# Patient Record
Sex: Male | Born: 1995
Health system: Southern US, Community
[De-identification: ages and names within clinical notes are randomized; demographics above are authoritative.]

## PROBLEM LIST (undated history)

## (undated) ENCOUNTER — Ambulatory Visit (HOSPITAL_COMMUNITY): Disposition: A | Discharge status: Other Acute Inpatient Hospital | Payer: 59

## (undated) ENCOUNTER — Ambulatory Visit (HOSPITAL_COMMUNITY): Discharge status: Absent without leave | Payer: 59

## (undated) ENCOUNTER — Ambulatory Visit (HOSPITAL_COMMUNITY): Admission: EM | Payer: Self-pay | Source: Home / Self Care

## (undated) DIAGNOSIS — F319 Bipolar disorder, unspecified: Secondary | ICD-10-CM

## (undated) DIAGNOSIS — W3400XA Accidental discharge from unspecified firearms or gun, initial encounter: Secondary | ICD-10-CM

## (undated) HISTORY — PX: CYST EXCISION: SHX5701

---
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
## 2015-02-02 ENCOUNTER — Encounter (HOSPITAL_COMMUNITY): Payer: Self-pay | Admitting: *Deleted

## 2015-02-02 ENCOUNTER — Emergency Department (HOSPITAL_COMMUNITY)
Admission: EM | Admit: 2015-02-02 | Discharge: 2015-02-02 | Disposition: A | Discharge status: Home / Self Care | Payer: Medicaid Other | Attending: Emergency Medicine | Admitting: Emergency Medicine

## 2015-02-02 DIAGNOSIS — F10929 Alcohol use, unspecified with intoxication, unspecified: Secondary | ICD-10-CM

## 2015-02-02 DIAGNOSIS — Z720 Tobacco use: Secondary | ICD-10-CM | POA: Insufficient documentation

## 2015-02-02 DIAGNOSIS — F10129 Alcohol abuse with intoxication, unspecified: Secondary | ICD-10-CM | POA: Insufficient documentation
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
## 2015-02-02 NOTE — ED Notes (Signed)
Pt awaken, given breakfast tray and eaten 100% of meal. Pt encourage to call family/friend for transport to home. Pt verbalized understanding.

## 2015-02-02 NOTE — ED Notes (Signed)
Resting quietly with eye closed. Easily arousable. Verbally responsive. Resp even and unlabored. No audible adventitious breath sounds noted. ABC's intact. NAD noted.
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
## 2015-02-02 NOTE — ED Notes (Signed)
Resting quietly with eye closed. Easily arousable. Verbally responsive. Resp even and unlabored. ABC's intact. NAD noted.  
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
## 2015-02-02 NOTE — Discharge Instructions (Signed)
Alcohol Intoxication  Alcohol intoxication occurs when you drink enough alcohol that it affects your ability to function. It can be mild or very severe. Drinking a lot of alcohol in a short time is called binge drinking. This can be very harmful. Drinking alcohol can also be more dangerous if you are taking medicines or other drugs. Some of the effects caused by alcohol may include:  · Loss of coordination.  · Changes in mood and behavior.  · Unclear thinking.  · Trouble talking (slurred speech).  · Throwing up (vomiting).  · Confusion.  · Slowed breathing.  · Twitching and shaking (seizures).  · Loss of consciousness.  HOME CARE  · Do not drive after drinking alcohol.  · Drink enough water and fluids to keep your pee (urine) clear or pale yellow. Avoid caffeine.  · Only take medicine as told by your doctor.  GET HELP IF:  · You throw up (vomit) many times.  · You do not feel better after a few days.  · You frequently have alcohol intoxication. Your doctor can help decide if you should see a substance use treatment counselor.  GET HELP RIGHT AWAY IF:  · You become shaky when you stop drinking.  · You have twitching and shaking.  · You throw up blood. It may look bright red or like coffee grounds.  · You notice blood in your poop (bowel movements).  · You become lightheaded or pass out (faint).  MAKE SURE YOU:   · Understand these instructions.  · Will watch your condition.  · Will get help right away if you are not doing well or get worse.     This information is not intended to replace advice given to you by your health care provider. Make sure you discuss any questions you have with your health care provider.     Document Released: 09/08/2007 Document Revised: 11/22/2012 Document Reviewed: 08/25/2012  Elsevier Interactive Patient Education ©2016 [employer]

## 2015-03-01 ENCOUNTER — Encounter (HOSPITAL_COMMUNITY): Payer: Self-pay | Admitting: *Deleted

## 2015-03-01 ENCOUNTER — Emergency Department (INDEPENDENT_AMBULATORY_CARE_PROVIDER_SITE_OTHER): Payer: No Typology Code available for payment source

## 2015-03-01 ENCOUNTER — Emergency Department (INDEPENDENT_AMBULATORY_CARE_PROVIDER_SITE_OTHER)
Admission: EM | Admit: 2015-03-01 | Discharge: 2015-03-01 | Disposition: A | Discharge status: Home / Self Care | Payer: No Typology Code available for payment source | Source: Home / Self Care

## 2015-03-01 DIAGNOSIS — J40 Bronchitis, not specified as acute or chronic: Secondary | ICD-10-CM

## 2015-03-01 MED ORDER — PREDNISONE 50 MG PO TABS: ORAL_TABLET | ORAL | Status: DC

## 2015-03-01 MED ORDER — ALBUTEROL SULFATE (2.5 MG/3ML) 0.083% IN NEBU
INHALATION_SOLUTION | RESPIRATORY_TRACT | Status: AC
  Filled 2015-03-01: qty 3

## 2015-03-01 MED ORDER — IPRATROPIUM-ALBUTEROL 0.5-2.5 (3) MG/3ML IN SOLN
RESPIRATORY_TRACT | Status: AC
  Filled 2015-03-01: qty 3

## 2015-03-01 MED ORDER — ALBUTEROL SULFATE (2.5 MG/3ML) 0.083% IN NEBU
2.5000 mg | INHALATION_SOLUTION | Freq: Once | RESPIRATORY_TRACT | Status: AC
  Administered 2015-03-01: 2.5 mg via RESPIRATORY_TRACT

## 2015-03-01 MED ORDER — AZITHROMYCIN 250 MG PO TABS: 250.0000 mg | ORAL_TABLET | Freq: Every day | ORAL | Status: DC

## 2015-03-01 MED ORDER — IPRATROPIUM-ALBUTEROL 0.5-2.5 (3) MG/3ML IN SOLN
3.0000 mL | Freq: Once | RESPIRATORY_TRACT | Status: AC
  Administered 2015-03-01: 3 mL via RESPIRATORY_TRACT

## 2015-03-01 MED ORDER — ALBUTEROL SULFATE HFA 108 (90 BASE) MCG/ACT IN AERS: 2.0000 | INHALATION_SPRAY | RESPIRATORY_TRACT | Status: DC | PRN

## 2015-03-01 NOTE — ED Notes (Signed)
Pt  Reports  Symptoms     Of  sorethroat   Cough   Congested     X  1  Week        Pt  Reports  Pain in  His  Chest  When  He  Coughs  As  Well      Pt   States  Body  Aches    And  Pain  When  He  Swallows

## 2015-03-01 NOTE — Discharge Instructions (Signed)
Upper Respiratory Infection, Adult °Most upper respiratory infections (URIs) are caused by a virus. A URI affects the nose, throat, and upper air passages. The most common type of URI is often called "the common cold." °HOME CARE  °· Take medicines only as told by your doctor. °· Gargle warm saltwater or take cough drops to comfort your throat as told by your doctor. °· Use a warm mist humidifier or inhale steam from a shower to increase air moisture. This may make it easier to breathe. °· Drink enough fluid to keep your pee (urine) clear or pale yellow. °· Eat soups and other clear broths. °· Have a healthy diet. °· Rest as needed. °· Go back to work when your fever is gone or your doctor says it is okay. °¨ You may need to stay home longer to avoid giving your URI to others. °¨ You can also wear a face mask and wash your hands often to prevent spread of the virus. °· Use your inhaler more if you have asthma. °· Do not use any tobacco products, including cigarettes, chewing tobacco, or electronic cigarettes. If you need help quitting, ask your doctor. °GET HELP IF: °· You are getting worse, not better. °· Your symptoms are not helped by medicine. °· You have chills. °· You are getting more short of breath. °· You have brown or red mucus. °· You have yellow or brown discharge from your nose. °· You have pain in your face, especially when you bend forward. °· You have a fever. °· You have puffy (swollen) neck glands. °· You have pain while swallowing. °· You have white areas in the back of your throat. °GET HELP RIGHT AWAY IF:  °· You have very bad or constant: °· Headache. °· Ear pain. °· Pain in your forehead, behind your eyes, and over your cheekbones (sinus pain). °· Chest pain. °· You have long-lasting (chronic) lung disease and any of the following: °· Wheezing. °· Long-lasting cough. °· Coughing up blood. °· A change in your usual mucus. °· You have a stiff neck. °· You have changes in  your: °· Vision. °· Hearing. °· Thinking. °· Mood. °MAKE SURE YOU:  °· Understand these instructions. °· Will watch your condition. °· Will get help right away if you are not doing well or get worse. °  °This information is not intended to replace advice given to you by your health care provider. Make sure you discuss any questions you have with your health care provider. °  °Document Released: 09/08/2007 Document Revised: 08/06/2014 Document Reviewed: 06/27/2013 °Elsevier Interactive Patient Education ©2016 [employer] °Acute Bronchitis °Bronchitis is when the airways that extend from the windpipe into the lungs get red, puffy, and painful (inflamed). Bronchitis often causes thick spit (mucus) to develop. This leads to a cough. A cough is the most common symptom of bronchitis. °In acute bronchitis, the condition usually begins suddenly and goes away over time (usually in 2 weeks). Smoking, allergies, and asthma can make bronchitis worse. Repeated episodes of bronchitis may cause more lung problems. °HOME CARE °· Rest. °· Drink enough fluids to keep your pee (urine) clear or pale yellow (unless you need to limit fluids as told by your doctor). °· Only take over-the-counter or prescription medicines as told by your doctor. °· Avoid smoking and secondhand smoke. These can make bronchitis worse. If you are a smoker, think about using nicotine gum or skin patches. Quitting smoking will help your lungs heal faster. °· Reduce the   chance of getting bronchitis again by: °¨ Washing your hands often. °¨ Avoiding people with cold symptoms. °¨ Trying not to touch your hands to your mouth, nose, or eyes. °· Follow up with your doctor as told. °GET HELP IF: °Your symptoms do not improve after 1 week of treatment. Symptoms include: °· Cough. °· Fever. °· Coughing up thick spit. °· Body aches. °· Chest congestion. °· Chills. °· Shortness of breath. °· Sore throat. °GET HELP RIGHT AWAY IF:  °· You have an increased fever. °· You  have chills. °· You have severe shortness of breath. °· You have bloody thick spit (sputum). °· You throw up (vomit) often. °· You lose too much body fluid (dehydration). °· You have a severe headache. °· You faint. °MAKE SURE YOU:  °· Understand these instructions. °· Will watch your condition. °· Will get help right away if you are not doing well or get worse. °  °This information is not intended to replace advice given to you by your health care provider. Make sure you discuss any questions you have with your health care provider. °  °Document Released: 09/08/2007 Document Revised: 11/22/2012 Document Reviewed: 09/12/2012 °Elsevier Interactive Patient Education ©2016 [employer] ° °

## 2015-03-01 NOTE — ED Provider Notes (Signed)
CSN: [id number]     Arrival date & time 03/01/15  1414 History   None    Chief Complaint  Patient presents with  . URI   (Consider location/radiation/quality/duration/timing/severity/associated sxs/prior Treatment) HPI History obtained from patient:   LOCATION:chest SEVERITY:no pain DURATION:over 1 week CONTEXT:sudden onset QUALITY: MODIFYING FACTORS:OTC without relief ASSOCIATED SYMPTOMS:sputum, wheezing TIMING:constant OCCUPATION: food server  History reviewed. No pertinent past medical history. History reviewed. No pertinent past surgical history. History reviewed. No pertinent family history. Social History  Substance Use Topics  . Smoking status: Current Every Day Smoker  . Smokeless tobacco: None  . Alcohol Use: Yes    Review of Systems ROS +'vecough, chest tightness  Denies: HEADACHE, NAUSEA, ABDOMINAL PAIN, CHEST PAIN, CONGESTION, DYSURIA, SHORTNESS OF BREATH  Allergies  Review of patient's allergies indicates no known allergies.  Home Medications   Prior to Admission medications   Medication Sig Start Date End Date Taking? Authorizing Provider  albuterol (PROVENTIL HFA;VENTOLIN HFA) 108 (90 BASE) MCG/ACT inhaler Inhale 2 puffs into the lungs every 4 (four) hours as needed for wheezing or shortness of breath. 03/01/15   Tharon AquasFrank C Patrick, PA  azithromycin (ZITHROMAX) 250 MG tablet Take 1 tablet (250 mg total) by mouth daily. Take first 2 tablets together, then 1 every day until finished. 03/01/15   Tharon AquasFrank C Patrick, PA  ibuprofen (ADVIL,MOTRIN) 600 MG tablet Take 1 tablet (600 mg total) by mouth every 6 (six) hours as needed. 12/13/13   Antony MaduraKelly Humes, PA-C  predniSONE (DELTASONE) 50 MG tablet 1 tablet daily for 5 days 03/01/15   Tharon AquasFrank C Patrick, PA   Meds Ordered and Administered this Visit   Medications  albuterol (PROVENTIL) (2.5 MG/3ML) 0.083% nebulizer solution 2.5 mg (2.5 mg Nebulization Given 03/01/15 1602)  ipratropium-albuterol (DUONEB) 0.5-2.5 (3)  MG/3ML nebulizer solution 3 mL (3 mLs Nebulization Given 03/01/15 1602)    BP 122/74 mmHg  Pulse 71  Temp(Src) 98.8 F (37.1 C) (Oral)  Resp 18  SpO2 97% No data found.   Physical Exam  Constitutional: He is oriented to person, place, and time. He appears well-developed and well-nourished.  HENT:  Head: Normocephalic and atraumatic.  Eyes: Conjunctivae are normal.  Pulmonary/Chest: Effort normal. He has wheezes.  Abdominal: Soft. There is no tenderness. There is no rebound and no guarding.  Musculoskeletal: Normal range of motion.  Neurological: He is alert and oriented to person, place, and time.  Skin: Skin is warm and dry.  Psychiatric: He has a normal mood and affect. His behavior is normal. Judgment and thought content normal.  Nursing note and vitals reviewed.   ED Course  Procedures (including critical care time)  Labs Review Labs Reviewed - No data to display  Imaging Review No results found.   Visual Acuity Review  Right Eye Distance:   Left Eye Distance:   Bilateral Distance:    Right Eye Near:   Left Eye Near:    Bilateral Near:         MDM   1. Bronchitis    Treatment with DuoNeb with improvement in his symptoms. Independent review of his chest x-ray:  Prescriptions for Zithromax, prednisone, albuterol. Patient is advised and instructed to return to the emergency Department or urgent care and there are no worsening of his symptoms. Patient is advised that he shouldn't seek help in smoking cessation.    Tharon AquasFrank C Patrick, PA 03/02/15 1153

## 2015-03-02 ENCOUNTER — Encounter (HOSPITAL_COMMUNITY): Payer: Self-pay

## 2015-03-02 ENCOUNTER — Emergency Department (HOSPITAL_COMMUNITY)
Admission: EM | Admit: 2015-03-02 | Discharge: 2015-03-02 | Disposition: A | Discharge status: Home / Self Care | Payer: No Typology Code available for payment source | Attending: Emergency Medicine | Admitting: Emergency Medicine

## 2015-03-02 ENCOUNTER — Emergency Department (HOSPITAL_COMMUNITY): Payer: No Typology Code available for payment source

## 2015-03-02 DIAGNOSIS — J111 Influenza due to unidentified influenza virus with other respiratory manifestations: Secondary | ICD-10-CM

## 2015-03-02 DIAGNOSIS — F17200 Nicotine dependence, unspecified, uncomplicated: Secondary | ICD-10-CM | POA: Insufficient documentation

## 2015-03-02 DIAGNOSIS — R69 Illness, unspecified: Secondary | ICD-10-CM

## 2015-03-02 DIAGNOSIS — R079 Chest pain, unspecified: Secondary | ICD-10-CM | POA: Diagnosis present

## 2015-03-02 MED ORDER — PREDNISONE 20 MG PO TABS
60.0000 mg | ORAL_TABLET | Freq: Once | ORAL | Status: AC
  Administered 2015-03-02: 60 mg via ORAL
  Filled 2015-03-02: qty 3

## 2015-03-02 MED ORDER — ACETAMINOPHEN-CODEINE #3 300-30 MG PO TABS
1.0000 | ORAL_TABLET | Freq: Once | ORAL | Status: AC
  Administered 2015-03-02: 1 via ORAL
  Filled 2015-03-02: qty 1

## 2015-03-02 MED ORDER — IBUPROFEN 400 MG PO TABS
600.0000 mg | ORAL_TABLET | Freq: Once | ORAL | Status: AC
  Administered 2015-03-02: 600 mg via ORAL
  Filled 2015-03-02: qty 1

## 2015-03-02 MED ORDER — IPRATROPIUM BROMIDE 0.02 % IN SOLN
0.5000 mg | Freq: Once | RESPIRATORY_TRACT | Status: AC
Start: 2015-03-02 — End: 2015-03-02
  Administered 2015-03-02: 0.5 mg via RESPIRATORY_TRACT
  Filled 2015-03-02: qty 2.5

## 2015-03-02 MED ORDER — ACETAMINOPHEN-CODEINE 120-12 MG/5ML PO SOLN: 10.0000 mL | ORAL | Status: DC | PRN

## 2015-03-02 MED ORDER — PREDNISONE 50 MG PO TABS: 50.0000 mg | ORAL_TABLET | Freq: Every day | ORAL | Status: DC

## 2015-03-02 MED ORDER — SODIUM CHLORIDE 0.9 % IV BOLUS (SEPSIS)
1000.0000 mL | Freq: Once | INTRAVENOUS | Status: AC
  Administered 2015-03-02: 1000 mL via INTRAVENOUS

## 2015-03-02 MED ORDER — ALBUTEROL SULFATE (2.5 MG/3ML) 0.083% IN NEBU
5.0000 mg | INHALATION_SOLUTION | Freq: Once | RESPIRATORY_TRACT | Status: AC
  Administered 2015-03-02: 5 mg via RESPIRATORY_TRACT
  Filled 2015-03-02: qty 6

## 2015-03-02 MED ORDER — GUAIFENESIN ER 1200 MG PO TB12: 1.0000 | ORAL_TABLET | Freq: Two times a day (BID) | ORAL | Status: DC

## 2015-03-02 MED ORDER — ALBUTEROL SULFATE HFA 108 (90 BASE) MCG/ACT IN AERS
2.0000 | INHALATION_SPRAY | RESPIRATORY_TRACT | Status: DC
  Administered 2015-03-02: 2 via RESPIRATORY_TRACT
  Filled 2015-03-02: qty 6.7

## 2015-03-02 MED ORDER — ONDANSETRON 4 MG PO TBDP
8.0000 mg | ORAL_TABLET | Freq: Once | ORAL | Status: AC
  Administered 2015-03-02: 8 mg via ORAL
  Filled 2015-03-02: qty 2

## 2015-03-02 MED ORDER — AEROCHAMBER PLUS FLO-VU MEDIUM MISC
1.0000 | Freq: Once | Status: AC
  Administered 2015-03-02: 1
  Filled 2015-03-02: qty 1

## 2015-03-02 NOTE — ED Notes (Signed)
Patient transported to X-ray 

## 2015-03-02 NOTE — Discharge Instructions (Signed)
Return here as needed.  Follow up with her primary care doctor °

## 2015-03-02 NOTE — ED Provider Notes (Signed)
CSN: [id number]     Arrival date & time 03/02/15  3086 History   First MD Initiated Contact with Patient 03/02/15 0636     Chief Complaint  Patient presents with  . Chest Pain     (Consider location/radiation/quality/duration/timing/severity/associated sxs/prior Treatment) HPI Patient presents to the emergency department with cough, body aches, sore throat, nasal congestion and drainage.  The patient states that he has had these symptoms for the last week.  He said he was seen at urgent care yesterday, but did not get his prescriptions filled.  The patient states he has chest discomfort when he coughs.  Patient also complains of wheezing.  The patient states that he did not take any other medications prior to arrival.  Patient denies shortness of breath, weakness, dizziness, headache, blurred vision, dysuria, incontinence, abdominal pain, nausea, vomiting, diarrhea, rash, near syncope or syncope.  The patient states that his symptoms are worse with activity History reviewed. No pertinent past medical history. History reviewed. No pertinent past surgical history. History reviewed. No pertinent family history. Social History  Substance Use Topics  . Smoking status: Current Every Day Smoker  . Smokeless tobacco: None  . Alcohol Use: Yes    Review of Systems  All other systems negative except as documented in the HPI. All pertinent positives and negatives as reviewed in the HPI.  Allergies  Review of patient's allergies indicates no known allergies.  Home Medications   Prior to Admission medications   Medication Sig Start Date End Date Taking? Authorizing Provider  albuterol (PROVENTIL HFA;VENTOLIN HFA) 108 (90 BASE) MCG/ACT inhaler Inhale 2 puffs into the lungs every 4 (four) hours as needed for wheezing or shortness of breath. Patient not taking: Reported on 03/02/2015 03/01/15   Tharon Aquas, PA  azithromycin (ZITHROMAX) 250 MG tablet Take 1 tablet (250 mg total) by mouth daily.  Take first 2 tablets together, then 1 every day until finished. Patient not taking: Reported on 03/02/2015 03/01/15   Tharon Aquas, PA   BP 102/53 mmHg  Pulse 62  Temp(Src) 97.8 F (36.6 C) (Oral)  Resp 20  SpO2 96% Physical Exam  Constitutional: He is oriented to person, place, and time. He appears well-developed and well-nourished. No distress.  HENT:  Head: Normocephalic and atraumatic.  Mouth/Throat: Oropharynx is clear and moist.  Eyes: Pupils are equal, round, and reactive to light.  Neck: Normal range of motion. Neck supple.  Cardiovascular: Normal rate, regular rhythm and normal heart sounds.  Exam reveals no gallop and no friction rub.   No murmur heard. Pulmonary/Chest: Effort normal. No respiratory distress. He has wheezes.  Abdominal: Soft. Bowel sounds are normal. He exhibits no distension. There is no tenderness.  Neurological: He is alert and oriented to person, place, and time. He exhibits normal muscle tone. Coordination normal.  Skin: Skin is warm and dry. No rash noted. No erythema.  Psychiatric: He has a normal mood and affect. His behavior is normal.  Nursing note and vitals reviewed.   ED Course  Procedures (including critical care time) Labs Review Labs Reviewed - No data to display  Imaging Review Dg Chest 2 View  03/02/2015  CLINICAL DATA:  Central chest pain shortness of breath for 1 week. Recently diagnosed with bronchitis. Cough, body aches, and vomiting. EXAM: CHEST  2 VIEW COMPARISON:  03/01/2015 FINDINGS: The cardiomediastinal silhouette is within normal limits. The lungs are well inflated and clear. There is no evidence of pleural effusion or pneumothorax. No acute osseous abnormality is  identified. IMPRESSION: No active cardiopulmonary disease. Electronically Signed   By: Sebastian AcheAllen  Grady M.D.   On: 03/02/2015 07:34   Dg Chest 2 View  03/01/2015  CLINICAL DATA:  Productive cough and fever for 1 week. Initial encounter. EXAM: CHEST  2 VIEW  COMPARISON:  None. FINDINGS: The lungs are clear. Heart size is normal. No pneumothorax or pleural effusion. No focal bony abnormality. IMPRESSION: Negative chest. Electronically Signed   By: Drusilla Kannerhomas  Dalessio M.D.   On: 03/01/2015 16:45   I have personally reviewed and evaluated these images and lab results as part of my medical decision-making.   EKG Interpretation None     Patient retreated for a flulike illness.  The patient was seen in urgent care yesterday, but did not get his medications filled.  Patient is advised return here as needed.  Advised him that this could last for another 7-10 days   Charlestine NightChristopher Skarlet Lyons, PA-C 03/02/15 0957  Charlestine Nighthristopher Osie Merkin, PA-C 03/02/15 [id number]  Azalia BilisKevin Campos, MD 03/08/15 [phone]

## 2015-03-02 NOTE — ED Notes (Signed)
Pt arrived via POV from home c/o generalized aching chest pain, SOB and vomiting.

## 2015-05-09 ENCOUNTER — Encounter (HOSPITAL_COMMUNITY): Payer: Self-pay

## 2015-05-09 ENCOUNTER — Emergency Department (HOSPITAL_COMMUNITY)
Admission: EM | Admit: 2015-05-09 | Discharge: 2015-05-09 | Disposition: A | Discharge status: Home / Self Care | Payer: No Typology Code available for payment source | Attending: Emergency Medicine | Admitting: Emergency Medicine

## 2015-05-09 DIAGNOSIS — Z79899 Other long term (current) drug therapy: Secondary | ICD-10-CM | POA: Insufficient documentation

## 2015-05-09 DIAGNOSIS — Z87891 Personal history of nicotine dependence: Secondary | ICD-10-CM | POA: Diagnosis not present

## 2015-05-09 DIAGNOSIS — F1210 Cannabis abuse, uncomplicated: Secondary | ICD-10-CM | POA: Diagnosis not present

## 2015-05-09 DIAGNOSIS — R5383 Other fatigue: Secondary | ICD-10-CM | POA: Insufficient documentation

## 2015-05-09 DIAGNOSIS — R440 Auditory hallucinations: Secondary | ICD-10-CM

## 2015-05-09 DIAGNOSIS — F319 Bipolar disorder, unspecified: Secondary | ICD-10-CM | POA: Diagnosis not present

## 2015-05-09 HISTORY — DX: Bipolar disorder, unspecified: F31.9

## 2015-05-09 LAB — CBC
HEMATOCRIT: 43.1 % (ref 39.0–52.0)
Hemoglobin: 14.1 g/dL (ref 13.0–17.0)
MCH: 29.9 pg (ref 26.0–34.0)
MCHC: 32.7 g/dL (ref 30.0–36.0)
MCV: 91.5 fL (ref 78.0–100.0)
PLATELETS: 224 10*3/uL (ref 150–400)
RBC: 4.71 MIL/uL (ref 4.22–5.81)
RDW: 13.1 % (ref 11.5–15.5)
WBC: 6 10*3/uL (ref 4.0–10.5)

## 2015-05-09 LAB — SALICYLATE LEVEL: Salicylate Lvl: <4 mg/dL (ref 2.8–30.0)

## 2015-05-09 LAB — COMPREHENSIVE METABOLIC PANEL
ALT: 20 U/L (ref 17–63)
AST: 20 U/L (ref 15–41)
Albumin: 5.2 g/dL — ABNORMAL HIGH (ref 3.5–5.0)
Alkaline Phosphatase: 51 U/L (ref 38–126)
Anion gap: 11 (ref 5–15)
BILIRUBIN TOTAL: 0.8 mg/dL (ref 0.3–1.2)
BUN: 9 mg/dL (ref 6–20)
CO2: 26 mmol/L (ref 22–32)
CREATININE: 1.08 mg/dL (ref 0.61–1.24)
Calcium: 10.2 mg/dL (ref 8.9–10.3)
Chloride: 105 mmol/L (ref 101–111)
GFR calc Af Amer: >60 mL/min (ref >60)
Glucose, Bld: 99 mg/dL (ref 65–99)
POTASSIUM: 3.7 mmol/L (ref 3.5–5.1)
Sodium: 142 mmol/L (ref 135–145)
TOTAL PROTEIN: 8 g/dL (ref 6.5–8.1)

## 2015-05-09 LAB — ETHANOL

## 2015-05-09 LAB — ACETAMINOPHEN LEVEL: Acetaminophen (Tylenol), Serum: <10 ug/mL — ABNORMAL LOW (ref 10–30)

## 2015-05-09 LAB — CK: CK TOTAL: 260 U/L (ref 49–397)

## 2015-05-09 LAB — RAPID URINE DRUG SCREEN, HOSP PERFORMED
Amphetamines: NOT DETECTED
BARBITURATES: NOT DETECTED
BENZODIAZEPINES: NOT DETECTED
Cocaine: NOT DETECTED
Opiates: NOT DETECTED
Tetrahydrocannabinol: POSITIVE — AB

## 2015-05-09 MED ORDER — IBUPROFEN 200 MG PO TABS: 600.0000 mg | ORAL_TABLET | Freq: Three times a day (TID) | ORAL | Status: DC | PRN

## 2015-05-09 MED ORDER — ACETAMINOPHEN 325 MG PO TABS: 650.0000 mg | ORAL_TABLET | ORAL | Status: DC | PRN

## 2015-05-09 NOTE — BH Assessment (Signed)
Assessment completed. Consulted Hulan Fess, NP who recommended inpatient treatment for medication stabilization. Informed Dr. Anitra Lauth of recommendation. Counselor will speak with pt about recommendation.

## 2015-05-09 NOTE — ED Provider Notes (Addendum)
CSN: [id number]     Arrival date & time 05/09/15  1618 History   First MD Initiated Contact with Patient 05/09/15 1700     Chief Complaint  Patient presents with  . Hallucinations  . Leg Pain     (Consider location/radiation/quality/duration/timing/severity/associated sxs/prior Treatment) HPI Comments: Patient is a 20 year old male with a history of bipolar disease who has not been taking medications for years who presents with worsening auditory hallucinations and paranoia. He states the voices are telling me that someone is out to get me but denies the voices commanding him to hurt himself or others. He denies any suicidal or homicidal ideation. Ever weeks to a month he has been having increased difficulty sleeping and states has not slept at all for the last few days. Patient's family member states that he has seemed manic. Always on the go no time for rest and racing thoughts. Typically he states after these episodes he will crash. He does use alcohol and marijuana but sounds occasional and not heavy use. He denies other drug or tobacco use. Also complaining of myalgias  The history is provided by the patient and a parent.    Past Medical History  Diagnosis Date  . Bipolar disorder (HCC)    History reviewed. No pertinent past surgical history. No family history on file. Social History  Substance Use Topics  . Smoking status: Former Smoker    Types: Cigarettes  . Smokeless tobacco: None  . Alcohol Use: Yes    Review of Systems  Constitutional: Positive for fatigue. Negative for fever.  All other systems reviewed and are negative.     Allergies  Review of patient's allergies indicates no known allergies.  Home Medications   Prior to Admission medications   Medication Sig Start Date End Date Taking? Authorizing Provider  albuterol (PROVENTIL HFA;VENTOLIN HFA) 108 (90 BASE) MCG/ACT inhaler Inhale 2 puffs into the lungs every 4 (four) hours as needed for wheezing or  shortness of breath. 03/01/15  Yes Tharon Aquas, PA  acetaminophen-codeine 120-12 MG/5ML solution Take 10 mLs by mouth every 4 (four) hours as needed for moderate pain. Patient not taking: Reported on 05/09/2015 03/02/15   Charlestine Night, PA-C  azithromycin (ZITHROMAX) 250 MG tablet Take 1 tablet (250 mg total) by mouth daily. Take first 2 tablets together, then 1 every day until finished. Patient not taking: Reported on 03/02/2015 03/01/15   Tharon Aquas, PA  Guaifenesin 1200 MG TB12 Take 1 tablet (1,200 mg total) by mouth 2 (two) times daily. Patient not taking: Reported on 05/09/2015 03/02/15   Charlestine Night, PA-C  predniSONE (DELTASONE) 50 MG tablet Take 1 tablet (50 mg total) by mouth daily. Patient not taking: Reported on 05/09/2015 03/02/15   Charlestine Night, PA-C   BP 149/97 mmHg  Pulse 87  Temp(Src) 98.5 F (36.9 C)  Resp 16  SpO2 99% Physical Exam  Constitutional: He is oriented to person, place, and time. He appears well-developed and well-nourished. No distress.  HENT:  Head: Normocephalic and atraumatic.  Mouth/Throat: Oropharynx is clear and moist.  Eyes: Conjunctivae and EOM are normal. Pupils are equal, round, and reactive to light.  Neck: Normal range of motion. Neck supple.  Cardiovascular: Normal rate, regular rhythm and intact distal pulses.   No murmur heard. Pulmonary/Chest: Effort normal and breath sounds normal. No respiratory distress. He has no wheezes. He has no rales.  Abdominal: Soft. He exhibits no distension. There is no tenderness. There is no rebound and no guarding.  Musculoskeletal: Normal range of motion. He exhibits no edema or tenderness.  Neurological: He is alert and oriented to person, place, and time.  Skin: Skin is warm and dry. No rash noted. No erythema.  Psychiatric: His affect is blunt. He is withdrawn. Thought content is paranoid. He expresses no homicidal and no suicidal ideation.  Nursing note and vitals reviewed.   ED  Course  Procedures (including critical care time) Labs Review Labs Reviewed  COMPREHENSIVE METABOLIC PANEL - Abnormal; Notable for the following:    Albumin 5.2 (*)    All other components within normal limits  ACETAMINOPHEN LEVEL - Abnormal; Notable for the following:    Acetaminophen (Tylenol), Serum <10 (*)    All other components within normal limits  URINE RAPID DRUG SCREEN, HOSP PERFORMED - Abnormal; Notable for the following:    Tetrahydrocannabinol POSITIVE (*)    All other components within normal limits  ETHANOL  SALICYLATE LEVEL  CBC  CK    Imaging Review No results found. I have personally reviewed and evaluated these images and lab results as part of my medical decision-making.   EKG Interpretation None      MDM   Final diagnoses:  Bipolar 1 disorder [facility])  Auditory hallucination   Patient is a 20 year old male with a history of bipolar disease who has been off of medication for years and over the last few weeks to a month he's had worsening symptoms of what sounds like mania. He is not sleeping, highly active and now is starting to feel like he's crashing. He complains of diffuse body aches generally but denies any fever or infectious symptoms. He currently takes no medications does drink alcohol but denies heavy alcohol use, occasional marijuana use and no other medications or drugs.   Vital signs are within normal limits. Patient has no evidence of joint swelling or rashes. Medical clearance labs pending including a CK.  Feel that TTS will need to evaluate patient for his auditory elucidation some paranoia. He currently does not appear to be a threat to others or himself.  6:38 PM Pt is medically clear will consult TTS 8:50 PM Patient refuses to stay and at this time he is not a threat to himself or others. Stressed the importance of outpatient follow-up. Patient was urged multiple times to stay for medication adjustment but he refused.  Gwyneth Sprout,  MD 05/09/15 Paulo Fruit  Gwyneth Sprout, MD 05/09/15 1610  Gwyneth Sprout, MD 05/09/15 2051

## 2015-05-09 NOTE — BH Assessment (Signed)
Spoke with pt regarding treatment recommendation. Pt reported that he did not want to stay for inpatient treatment and would prefer to follow up on an outpatient basis. Pt was provided with a list of outpatient resources. Informed Dr. Anitra Lauth of updated plan.

## 2015-05-09 NOTE — ED Notes (Signed)
Pt belongings taken to car by family member

## 2015-05-09 NOTE — ED Notes (Addendum)
Pt c/o BLE pain and increased stress x "a long time" and auditory hallucinations x 2 days.  Pt reports "the voices are telling me that someone is out to get me."  Denies SI/HI.  Hx of Bipolar.  Sts he has not taken his medications "in like 6 years."  Denies injury to legs.  Pain score 6/10.  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

## 2015-05-09 NOTE — Discharge Instructions (Signed)
Bipolar Disorder °Bipolar disorder is a mental illness. The term bipolar disorder actually is used to describe a group of disorders that all share varying degrees of emotional highs and lows that can interfere with daily functioning, such as work, school, or relationships. Bipolar disorder also can lead to drug abuse, hospitalization, and suicide. °The emotional highs of bipolar disorder are periods of elation or irritability and high energy. These highs can range from a mild form (hypomania) to a severe form (mania). People experiencing episodes of hypomania may appear energetic, excitable, and highly productive. People experiencing mania may behave impulsively or erratically. They often make poor decisions. They may have difficulty sleeping. The most severe episodes of mania can involve having very distorted beliefs or perceptions about the world and seeing or hearing things that are not real (psychotic delusions and hallucinations).  °The emotional lows of bipolar disorder (depression) also can range from mild to severe. Severe episodes of bipolar depression can involve psychotic delusions and hallucinations. °Sometimes people with bipolar disorder experience a state of mixed mood. Symptoms of hypomania or mania and depression are both present during this mixed-mood episode. °SIGNS AND SYMPTOMS °There are signs and symptoms of the episodes of hypomania and mania as well as the episodes of depression. The signs and symptoms of hypomania and mania are similar but vary in severity. They include: °· Inflated self-esteem or feeling of increased self-confidence. °· Decreased need for sleep. °· Unusual talkativeness (rapid or pressured speech) or the feeling of a need to keep talking. °· Sensation of racing thoughts or constant talking, with quick shifts between topics that may or may not be related (flight of ideas). °· Decreased ability to focus or concentrate. °· Increased purposeful activity, such as work, studies,  or social activity, or nonproductive activity, such as pacing, squirming and fidgeting, or finger and toe tapping. °· Impulsive behavior and use of poor judgment, resulting in high-risk activities, such as having unprotected sex or spending excessive amounts of money. °Signs and symptoms of depression include the following:  °· Feelings of sadness, hopelessness, or helplessness. °· Frequent or uncontrollable episodes of crying. °· Lack of feeling anything or caring about anything. °· Difficulty sleeping or sleeping too much.  °· Inability to enjoy the things you used to enjoy.   °· Desire to be alone all the time.   °· Feelings of guilt or worthlessness.  °· Lack of energy or motivation.   °· Difficulty concentrating, remembering, or making decisions.  °· Change in appetite or weight beyond normal fluctuations. °· Thoughts of death or the desire to harm yourself. °DIAGNOSIS  °Bipolar disorder is diagnosed through an assessment by your caregiver. Your caregiver will ask questions about your emotional episodes. There are two main types of bipolar disorder. People with type I bipolar disorder have manic episodes with or without depressive episodes. People with type II bipolar disorder have hypomanic episodes and major depressive episodes, which are more serious than mild depression. The type of bipolar disorder you have can make an important difference in how your illness is monitored and treated. °Your caregiver may ask questions about your medical history and use of alcohol or drugs, including prescription medication. Certain medical conditions and substances also can cause emotional highs and lows that resemble bipolar disorder (secondary bipolar disorder).  °TREATMENT  °Bipolar disorder is a long-term illness. It is best controlled with continuous treatment rather than treatment only when symptoms occur. The following treatments can be prescribed for bipolar disorders: °· Medication--Medication can be prescribed by  a doctor that   is an expert in treating mental disorders (psychiatrists). Medications called mood stabilizers are usually prescribed to help control the illness. Other medications are sometimes added if symptoms of mania, depression, or psychotic delusions and hallucinations occur despite the use of a mood stabilizer. °· Talk therapy--Some forms of talk therapy are helpful in providing support, education, and guidance. °A combination of medication and talk therapy is best for managing the disorder over time. A procedure in which electricity is applied to your brain through your scalp (electroconvulsive therapy) is used in cases of severe mania when medication and talk therapy do not work or work too slowly. °  °This information is not intended to replace advice given to you by your health care provider. Make sure you discuss any questions you have with your health care provider. °  °Document Released: 06/28/2000 Document Revised: 04/12/2014 Document Reviewed: 04/17/2012 °Elsevier Interactive Patient Education ©2016 [employer] ° °

## 2015-05-09 NOTE — BH Assessment (Addendum)
Tele Assessment Note   Kurt Thornton is an 20 y.o. male presenting to [facility] accompanied by his mother. Pt stated "I feel better now but I had a nervous breakdown". Pt reported that he was under a lot of stress; however when asked about stressors pt only stated "people". Pt denies SI, HI and AVH at this time. Pt did not report any previous suicide attempts or self-injurious behaviors. Pt 's mother shared that there is a family history of bipolar and depression. She reported that pt has been in and out of facilities since the age of 54. She shared that once he turned 18 he no longer wanted to take medication. She reported that this is the first episode in approximately 3 years. She reported that pt has not been sleeping and shared that last week he was up for 48 hours. Pt did not report any problems with his appetite nor did he share any weight changes. Pt only reported alcohol use; however his UDS is positive for THC. Pt reported that he has an upcoming court date for possession of a firearm by a felon. PT did not report any physical, sexual or emotional abuse at this time.  Inpatient treatment is recommended for medication stabilization. Pt declined the recommendation and reported that he would rather follow up with an outpatient provider.   Diagnosis: Bipolar   Past Medical History:  Past Medical History  Diagnosis Date  . Bipolar disorder (HCC)     History reviewed. No pertinent past surgical history.  Family History: No family history on file.  Social History:  reports that he has quit smoking. His smoking use included Cigarettes. He does not have any smokeless tobacco history on file. He reports that he drinks alcohol. He reports that he uses illicit drugs (Marijuana).  Additional Social History:  Alcohol / Drug Use History of alcohol / drug use?: Yes Substance #1 Name of Substance 1: THC  1 - Age of First Use: unknown  1 - Amount (size/oz): unknown  1 - Frequency: unknown  1 -  Duration: ongoing  1 - Last Use / Amount: unknown UDS positive  Substance #2 Name of Substance 2: Alcohol  2 - Age of First Use: unknown  2 - Amount (size/oz): unknown  2 - Frequency: weekly  2 - Duration: ongoing  2 - Last Use / Amount: 05-08-15  CIWA: CIWA-Ar BP: 149/97 mmHg Pulse Rate: 87 COWS:    PATIENT STRENGTHS: (choose at least two) Average or above average intelligence Supportive family/friends  Allergies: No Known Allergies  Home Medications:  (Not in a hospital admission)  OB/GYN Status:  No LMP for male patient.  General Assessment Data Location of Assessment: [facility] T[facility] Assessment: In system Is this a Tele or Face-to-Face Assessment?: Face-to-Face Is this an Initial Assessment or a Re-assessment for this encounter?: Initial Assessment Marital status: Single Living Arrangements: Other relatives Can pt return to current living arrangement?: Yes Admission Status: Voluntary Is patient capable of signing voluntary admission?: Yes Referral Source: Self/Family/Friend Insurance type: [employer] MULTIPLAN      Crisis Care Plan Living Arrangements: Other relatives Name of Psychiatrist: No provider report Name of Therapist: No provider reported.   Education Status Is patient currently in school?: No Current Grade: N/A Highest grade of school patient has completed: 88 Name of school: N/A Contact person: N/A  Risk to self with the past 6 months Suicidal Ideation: No Has patient been a risk to self within the past 6 months prior to admission? :  No Suicidal Intent: No Has patient had any suicidal intent within the past 6 months prior to admission? : No Is patient at risk for suicide?: No Suicidal Plan?: No Has patient had any suicidal plan within the past 6 months prior to admission? : No Access to Means: No What has been your use of drugs/alcohol within the last 12 months?: UDS is positive for THC. Alcohol use reported.  Previous Attempts/Gestures: No How many  times?: 0 Other Self Harm Risks: Pt denies  Triggers for Past Attempts: None known Intentional Self Injurious Behavior: None Family Suicide History: No Recent stressful life event(s): Other (Comment) ("people" ) Persecutory voices/beliefs?: No Depression: Yes Depression Symptoms: Insomnia, Feeling angry/irritable Substance abuse history and/or treatment for substance abuse?: Yes  Risk to Others within the past 6 months Homicidal Ideation: No Does patient have any lifetime risk of violence toward others beyond the six months prior to admission? : No Thoughts of Harm to Others: No Current Homicidal Intent: No Current Homicidal Plan: No Access to Homicidal Means: No Identified Victim: N/A History of harm to others?: No Assessment of Violence: None Noted Violent Behavior Description: No violent behaviors observed.  Does patient have access to weapons?: No Criminal Charges Pending?: Yes Describe Pending Criminal Charges: Pleas Patricia possession of firearm by felon  Does patient have a court date: Yes Court Date: 05/19/15 Is patient on probation?: No  Psychosis Hallucinations: Auditory Delusions: None noted  Mental Status Report Appearance/Hygiene: Body odor, In scrubs Eye Contact: Good Motor Activity: Freedom of movement Speech: Logical/coherent Level of Consciousness: Quiet/awake Mood: Euthymic Affect: Appropriate to circumstance Anxiety Level: Minimal Thought Processes: Coherent, Relevant Judgement: Unimpaired Orientation: Person, Time, Situation, Place, Appropriate for developmental age Obsessive Compulsive Thoughts/Behaviors: None  Cognitive Functioning Concentration: Decreased Memory: Recent Intact IQ: Average Insight: Fair Impulse Control: Fair Appetite: Good Weight Loss: 0 Weight Gain: 0 Sleep: Decreased Total Hours of Sleep:  (per mother pt is up for days at a time. ) Vegetative Symptoms: None  ADLScreening [facility] Assessment Services) Patient's cognitive  ability adequate to safely complete daily activities?: Yes Patient able to express need for assistance with ADLs?: Yes Independently performs ADLs?: Yes (appropriate for developmental age)  Prior Inpatient Therapy Prior Inpatient Therapy: Yes Prior Therapy Dates:  (Childhood) Prior Therapy Facilty/Provider(s):  (Unknown ) Reason for Treatment:  ("personality disorder" )  Prior Outpatient Therapy Prior Outpatient Therapy: Yes Prior Therapy Dates:  (age 42-17) Prior Therapy Facilty/Provider(s): Mental Health  Reason for Treatment: personality disorder  Does patient have an ACCT team?: No Does patient have Intensive In-House Services?  : No Does patient have [facility] services? : No Does patient have P4CC services?: No  ADL Screening (condition at time of admission) Patient's cognitive ability adequate to safely complete daily activities?: Yes Patient able to express need for assistance with ADLs?: Yes Independently performs ADLs?: Yes (appropriate for developmental age)       Abuse/Neglect Assessment (Assessment to be complete while patient is alone) Physical Abuse: Denies Verbal Abuse: Denies Sexual Abuse: Denies Exploitation of patient/patient's resources: Denies Self-Neglect: Denies     Merchant navy officer (For Healthcare) Does patient have an advance directive?: No    Additional Information 1:1 In Past 12 Months?: No CIRT Risk: No Elopement Risk: No Does patient have medical clearance?: Yes     Disposition: Inpatient treatment is recommended.  Disposition Initial Assessment Completed for this Encounter: Yes  Angelita Harnack S 05/09/2015 7:46 PM

## 2015-06-30 ENCOUNTER — Encounter (HOSPITAL_COMMUNITY): Payer: Self-pay | Admitting: *Deleted

## 2015-06-30 ENCOUNTER — Emergency Department (HOSPITAL_COMMUNITY)
Admission: EM | Admit: 2015-06-30 | Discharge: 2015-06-30 | Disposition: A | Discharge status: Home / Self Care | Payer: No Typology Code available for payment source | Attending: Emergency Medicine | Admitting: Emergency Medicine

## 2015-06-30 DIAGNOSIS — Z8659 Personal history of other mental and behavioral disorders: Secondary | ICD-10-CM | POA: Insufficient documentation

## 2015-06-30 DIAGNOSIS — Z202 Contact with and (suspected) exposure to infections with a predominantly sexual mode of transmission: Secondary | ICD-10-CM

## 2015-06-30 DIAGNOSIS — Z87891 Personal history of nicotine dependence: Secondary | ICD-10-CM | POA: Insufficient documentation

## 2015-06-30 DIAGNOSIS — Z79899 Other long term (current) drug therapy: Secondary | ICD-10-CM | POA: Insufficient documentation

## 2015-06-30 LAB — URINALYSIS, ROUTINE W REFLEX MICROSCOPIC
Bilirubin Urine: NEGATIVE
GLUCOSE, UA: NEGATIVE mg/dL
Hgb urine dipstick: NEGATIVE
KETONES UR: NEGATIVE mg/dL
Nitrite: NEGATIVE
PH: 7 (ref 5.0–8.0)
Protein, ur: NEGATIVE mg/dL
Specific Gravity, Urine: 1.024 (ref 1.005–1.030)

## 2015-06-30 LAB — URINE MICROSCOPIC-ADD ON
RBC / HPF: NONE SEEN RBC/hpf (ref 0–5)
Squamous Epithelial / LPF: NONE SEEN

## 2015-06-30 MED ORDER — AZITHROMYCIN 250 MG PO TABS
1000.0000 mg | ORAL_TABLET | Freq: Once | ORAL | Status: AC
  Administered 2015-06-30: 1000 mg via ORAL
  Filled 2015-06-30: qty 4

## 2015-06-30 MED ORDER — STERILE WATER FOR INJECTION IJ SOLN
10.0000 mL | Freq: Once | INTRAMUSCULAR | Status: AC
  Administered 2015-06-30: 10 mL via INTRAMUSCULAR
  Filled 2015-06-30: qty 10

## 2015-06-30 MED ORDER — CEFTRIAXONE SODIUM 250 MG IJ SOLR
250.0000 mg | Freq: Once | INTRAMUSCULAR | Status: AC
  Administered 2015-06-30: 250 mg via INTRAMUSCULAR
  Filled 2015-06-30: qty 250

## 2015-06-30 NOTE — ED Notes (Signed)
Visitor with patient attempting to video this tech while drawing patients blood.  Asked visitor to turn off cell phone and stop filming me.  Visitor cursing and saying he can do whatever he wants.  Asked visitor to leave but he refused.  Advised I would call security.  He turned off phone at that time.

## 2015-06-30 NOTE — ED Notes (Signed)
OPT reports his Partner reported she had a STD

## 2015-06-30 NOTE — ED Notes (Signed)
Declined W/C at D/C and was escorted to lobby by RN. 

## 2015-06-30 NOTE — ED Provider Notes (Signed)
CSN: [id number]     Arrival date & time 06/30/15  1508 History  By signing my name below, I, Gonzella Lex, attest that this documentation has been prepared under the direction and in the presence of [employer], PA-C. Electronically Signed: Gonzella Lex, Scribe. 06/30/2015. 4:49 PM.   Chief Complaint  Patient presents with  . Exposure to STD   The history is provided by the patient. No language interpreter was used.   HPI Comments: Kurt Thornton is a 20 y.o. male who presents to the Emergency Department requesting to be tested for STD's after being informed by the mother of his child, that she has chlamydia. Pt denies nausea vomiting, fever, dysuria, difficulty urinating, testicular pain, penile discharge, and abdominal pain. No other alleviating or aggravating factors. No other medical complaints.  Past Medical History  Diagnosis Date  . Bipolar disorder (HCC)    History reviewed. No pertinent past surgical history. History reviewed. No pertinent family history. Social History  Substance Use Topics  . Smoking status: Former Smoker    Types: Cigarettes  . Smokeless tobacco: None  . Alcohol Use: Yes    Review of Systems  Constitutional: Negative for fever.  Gastrointestinal: Negative for nausea, vomiting and abdominal pain.  Genitourinary: Negative for dysuria, discharge, difficulty urinating and testicular pain.  All other systems reviewed and are negative.  Allergies  Review of patient's allergies indicates no known allergies.  Home Medications   Prior to Admission medications   Medication Sig Start Date End Date Taking? Authorizing Provider  acetaminophen-codeine 120-12 MG/5ML solution Take 10 mLs by mouth every 4 (four) hours as needed for moderate pain. Patient not taking: Reported on 05/09/2015 03/02/15   Charlestine Night, PA-C  albuterol (PROVENTIL HFA;VENTOLIN HFA) 108 (90 BASE) MCG/ACT inhaler Inhale 2 puffs into the lungs every 4 (four) hours as  needed for wheezing or shortness of breath. 03/01/15   Tharon Aquas, PA  azithromycin (ZITHROMAX) 250 MG tablet Take 1 tablet (250 mg total) by mouth daily. Take first 2 tablets together, then 1 every day until finished. Patient not taking: Reported on 03/02/2015 03/01/15   Tharon Aquas, PA  Guaifenesin 1200 MG TB12 Take 1 tablet (1,200 mg total) by mouth 2 (two) times daily. Patient not taking: Reported on 05/09/2015 03/02/15   Charlestine Night, PA-C  predniSONE (DELTASONE) 50 MG tablet Take 1 tablet (50 mg total) by mouth daily. Patient not taking: Reported on 05/09/2015 03/02/15   Charlestine Night, PA-C   BP 128/70 mmHg  Pulse 69  Temp(Src) 98.4 F (36.9 C) (Oral)  Resp 16  Ht  (1.753 m)  Wt 79.379 kg  BMI 25.83 kg/m2  SpO2 100% Physical Exam  Constitutional: He is oriented to person, place, and time. He appears well-developed and well-nourished. No distress.  HENT:  Head: Normocephalic.  Eyes: Conjunctivae are normal.  Cardiovascular: Normal rate, regular rhythm and normal heart sounds.   Pulmonary/Chest: Effort normal.  Abdominal: Soft. He exhibits no distension. There is no tenderness.  Genitourinary:  Chaperone (scribe) was present for exam which was performed with no discomfort or complications.  No pain, no discharge. Normal male GU exam.  Neurological: He is alert and oriented to person, place, and time.  Skin: Skin is warm and dry.  Psychiatric: He has a normal mood and affect.  Nursing note and vitals reviewed.   ED Course  Procedures  DIAGNOSTIC STUDIES:    Oxygen Saturation is 100% on RA, normal by my interpretation.  COORDINATION OF CARE:  4:46 PM Will administer pt injection and pill. Will review labs. Discussed treatment plan with pt at bedside and pt agreed to plan.   Labs Review Labs Reviewed  URINALYSIS, ROUTINE W REFLEX MICROSCOPIC (NOT AT [facility]) - Abnormal; Notable for the following:    APPearance HAZY (*)    Leukocytes, UA SMALL (*)     All other components within normal limits  URINE MICROSCOPIC-ADD ON - Abnormal; Notable for the following:    Bacteria, UA FEW (*)    All other components within normal limits  RPR  HIV ANTIBODY (ROUTINE TESTING)  GC/CHLAMYDIA PROBE AMP ([place]) NOT AT [facility]   I have personally reviewed and evaluated these lab results as part of my medical decision-making. Meds given in ED:  Medications  cefTRIAXone (ROCEPHIN) injection 250 mg (250 mg Intramuscular Given 06/30/15 1649)  azithromycin (ZITHROMAX) tablet 1,000 mg (1,000 mg Oral Given 06/30/15 1649)  sterile water (preservative free) injection 10 mL (10 mLs Injection Given 06/30/15 1649)    Discharge Medication List as of 06/30/2015  5:02 PM     Filed Vitals:   06/30/15 1534 06/30/15 1632  BP: 129/68 128/70  Pulse: 66 69  Temp: 98.4 F (36.9 C)   TempSrc: Oral   Resp: 16 16  Height: 5\' 9"  (1.753 m)   Weight: 79.379 kg   SpO2: 100% 100%    MDM  Jajuan J Tomkinson is a 20 y.o. male who presents for evaluation of possible exposure to STI. Patient reports his current male partner was told she had chlamydia so he came to ED for treatment. Physical exam is unremarkable. He denies any symptoms. Obtained a urinalysis. Patient understands he has gonorrhea, chlamydia cultures pending as well as HIV and RPR. If HIV and RPR positive, will need to return for treatment/evaluation. Treated empirically in the emergency department with Rocephin and azithromycin. Instructed to follow-up with health department for further evaluation and testing. Final diagnoses:  Sexually transmitted disease exposure   I personally performed the services described in this documentation, which was scribed in my presence. The recorded information has been reviewed and is accurate.    Joycie PeekBenjamin Latonia Conrow, PA-C 06/30/15 1705  Mancel BaleElliott Wentz, MD 07/01/15 [phone]

## 2015-06-30 NOTE — ED Notes (Signed)
PT refused to stay for the recommended 30 mins  Post Im Injection. Pt walked out with friend.

## 2015-07-01 LAB — RPR: RPR: NONREACTIVE

## 2015-07-01 LAB — GC/CHLAMYDIA PROBE AMP (~~LOC~~) NOT AT ARMC
CHLAMYDIA, DNA PROBE: POSITIVE — AB
Neisseria Gonorrhea: NEGATIVE

## 2015-07-01 LAB — HIV ANTIBODY (ROUTINE TESTING W REFLEX): HIV Screen 4th Generation wRfx: NONREACTIVE

## 2015-07-02 ENCOUNTER — Telehealth (HOSPITAL_BASED_OUTPATIENT_CLINIC_OR_DEPARTMENT_OTHER): Payer: Self-pay | Admitting: Emergency Medicine

## 2015-08-06 ENCOUNTER — Telehealth (HOSPITAL_BASED_OUTPATIENT_CLINIC_OR_DEPARTMENT_OTHER): Payer: Self-pay | Admitting: Emergency Medicine

## 2015-08-06 NOTE — Telephone Encounter (Signed)
Lost to followup 

## 2015-11-13 ENCOUNTER — Encounter (HOSPITAL_COMMUNITY): Payer: Self-pay | Admitting: Emergency Medicine

## 2015-11-13 ENCOUNTER — Emergency Department (HOSPITAL_COMMUNITY)
Admission: EM | Admit: 2015-11-13 | Discharge: 2015-11-13 | Disposition: A | Discharge status: Home / Self Care | Payer: BLUE CROSS/BLUE SHIELD | Attending: Emergency Medicine | Admitting: Emergency Medicine

## 2015-11-13 DIAGNOSIS — F1910 Other psychoactive substance abuse, uncomplicated: Secondary | ICD-10-CM | POA: Diagnosis not present

## 2015-11-13 DIAGNOSIS — R531 Weakness: Secondary | ICD-10-CM | POA: Diagnosis present

## 2015-11-13 DIAGNOSIS — M791 Myalgia, unspecified site: Secondary | ICD-10-CM

## 2015-11-13 DIAGNOSIS — Z87891 Personal history of nicotine dependence: Secondary | ICD-10-CM | POA: Insufficient documentation

## 2015-11-13 LAB — CBC WITH DIFFERENTIAL/PLATELET
BASOS ABS: 0 10*3/uL (ref 0.0–0.1)
BASOS PCT: 1 %
EOS ABS: 0.1 10*3/uL (ref 0.0–0.7)
Eosinophils Relative: 1 %
HCT: 47 % (ref 39.0–52.0)
HEMOGLOBIN: 16.4 g/dL (ref 13.0–17.0)
Lymphocytes Relative: 37 %
Lymphs Abs: 2 10*3/uL (ref 0.7–4.0)
MCH: 31.4 pg (ref 26.0–34.0)
MCHC: 34.9 g/dL (ref 30.0–36.0)
MCV: 89.9 fL (ref 78.0–100.0)
Monocytes Absolute: 0.4 10*3/uL (ref 0.1–1.0)
Monocytes Relative: 8 %
NEUTROS PCT: 53 %
Neutro Abs: 3 10*3/uL (ref 1.7–7.7)
Platelets: 214 10*3/uL (ref 150–400)
RBC: 5.23 MIL/uL (ref 4.22–5.81)
RDW: 12.8 % (ref 11.5–15.5)
WBC: 5.5 10*3/uL (ref 4.0–10.5)

## 2015-11-13 LAB — COMPREHENSIVE METABOLIC PANEL
ALBUMIN: 4.6 g/dL (ref 3.5–5.0)
ALK PHOS: 51 U/L (ref 38–126)
ALT: 22 U/L (ref 17–63)
AST: 21 U/L (ref 15–41)
Anion gap: 8 (ref 5–15)
BUN: 18 mg/dL (ref 6–20)
CALCIUM: 9.6 mg/dL (ref 8.9–10.3)
CHLORIDE: 102 mmol/L (ref 101–111)
CO2: 27 mmol/L (ref 22–32)
CREATININE: 1.21 mg/dL (ref 0.61–1.24)
GFR calc Af Amer: >60 mL/min (ref >60)
GFR calc non Af Amer: >60 mL/min (ref >60)
GLUCOSE: 126 mg/dL — AB (ref 65–99)
Potassium: 3.5 mmol/L (ref 3.5–5.1)
SODIUM: 137 mmol/L (ref 135–145)
Total Bilirubin: 0.9 mg/dL (ref 0.3–1.2)
Total Protein: 7.7 g/dL (ref 6.5–8.1)

## 2015-11-13 LAB — CK: Total CK: 215 U/L (ref 49–397)

## 2015-11-13 MED ORDER — LIDOCAINE HCL (PF) 1 % IJ SOLN
INTRAMUSCULAR | Status: AC
  Administered 2015-11-13: 1.2 mL
  Filled 2015-11-13: qty 30

## 2015-11-13 MED ORDER — KETOROLAC TROMETHAMINE 60 MG/2ML IM SOLN
60.0000 mg | Freq: Once | INTRAMUSCULAR | Status: AC
  Administered 2015-11-13: 60 mg via INTRAMUSCULAR
  Filled 2015-11-13: qty 2

## 2015-11-13 MED ORDER — CEFTRIAXONE SODIUM 250 MG IJ SOLR
250.0000 mg | Freq: Once | INTRAMUSCULAR | Status: AC
  Administered 2015-11-13: 250 mg via INTRAMUSCULAR
  Filled 2015-11-13: qty 250

## 2015-11-13 MED ORDER — STERILE WATER FOR INJECTION IJ SOLN
INTRAMUSCULAR | Status: AC
  Filled 2015-11-13: qty 10

## 2015-11-13 MED ORDER — AZITHROMYCIN 1 G PO PACK
1.0000 g | PACK | Freq: Once | ORAL | Status: AC
  Administered 2015-11-13: 1 g via ORAL
  Filled 2015-11-13: qty 1

## 2015-11-13 NOTE — ED Triage Notes (Signed)
Patient presents via EMS for bilateral leg and arm cramping after taking "molly yesterday".  Last VS: 120/70, 60hr

## 2015-11-13 NOTE — ED Provider Notes (Signed)
[facility]-EMERGENCY DEPT Provider Note   CSN: [id number] Arrival date & time: 11/13/15  1436  First Provider Contact:  First MD Initiated Contact with Patient 11/13/15 1930        History   Chief Complaint Chief Complaint  Patient presents with  . Weakness    HPI Kurt Thornton is a 20 y.o. male.  HPI Kurt Thornton is a 20 y.o. male with history of bipolar disorder, presents to emergency department complaining of pain and weakness to bilateral legs and arms. Patient states symptoms started this afternoon around 1:30 PM. Patient states that he has been doing [place], alcohol, marijuana last 2 days. He states he hasn't used drugs in a while prior to yesterday and the day before, denies prior similar side effects. He states he is having difficulty walking due to pain that is located in feet and bilateral calves. He states he has not tried taking nothing for pain. He denies fever or chills. He denies any numbness. He denies any injuries or strenuous activity. He did have a viral illness several days ago which has not resolved. Denies neck pain or stiffness. No headache Patient is also requesting treatment for gonorrhea and chlamydia due to exposure from his girlfriend. He denies any complaints.  Past Medical History:  Diagnosis Date  . Bipolar disorder (HCC)     There are no active problems to display for this patient.   History reviewed. No pertinent surgical history.     Home Medications    Prior to Admission medications   Not on File    Family History History reviewed. No pertinent family history.  Social History Social History  Substance Use Topics  . Smoking status: Former Smoker    Types: Cigarettes  . Smokeless tobacco: Not on file  . Alcohol use Yes     Allergies   Review of patient's allergies indicates no known allergies.   Review of Systems Review of Systems  Constitutional: Negative for chills and fever.  Respiratory: Negative for cough, chest  tightness and shortness of breath.   Cardiovascular: Negative for chest pain, palpitations and leg swelling.  Gastrointestinal: Negative for abdominal distention, abdominal pain, diarrhea, nausea and vomiting.  Genitourinary: Negative for dysuria, frequency, hematuria and urgency.  Musculoskeletal: Positive for arthralgias and myalgias. Negative for neck pain and neck stiffness.  Skin: Negative for rash.  Allergic/Immunologic: Negative for immunocompromised state.  Neurological: Positive for weakness. Negative for dizziness, light-headedness, numbness and headaches.  All other systems reviewed and are negative.    Physical Exam Updated Vital Signs BP 120/74 (BP Location: Right Arm)   Pulse 60   Temp 97.5 F (36.4 C) (Oral)   Resp 20   Wt 77.1 kg   SpO2 100%   BMI 25.10 kg/m   Physical Exam  Constitutional: He appears well-developed and well-nourished. No distress.  HENT:  Head: Normocephalic and atraumatic.  Eyes: Conjunctivae are normal.  Neck: Neck supple.  Cardiovascular: Normal rate, regular rhythm and normal heart sounds.   Pulmonary/Chest: Effort normal. No respiratory distress. He has no wheezes. He has no rales.  Abdominal: Soft. Bowel sounds are normal. He exhibits no distension. There is no tenderness. There is no rebound.  Musculoskeletal: He exhibits no edema.  Neurological: He is alert.  Patellar and bicep reflexes 1+ bilaterally. Moving all extremities. Strength is 5/5 in bilateral upper and lower extremities. DP pulses intact bilaterally.  Skin: Skin is warm and dry.  Nursing note and vitals reviewed.    ED Treatments /  Results  Labs (all labs ordered are listed, but only abnormal results are displayed) Labs Reviewed  COMPREHENSIVE METABOLIC PANEL - Abnormal; Notable for the following:       Result Value   Glucose, Bld 126 (*)    All other components within normal limits  CBC WITH DIFFERENTIAL/PLATELET  CK    EKG  EKG Interpretation None        Radiology No results found.  Procedures Procedures (including critical care time)  Medications Ordered in ED Medications  ketorolac (TORADOL) injection 60 mg (60 mg Intramuscular Given 11/13/15 2028)  cefTRIAXone (ROCEPHIN) injection 250 mg (250 mg Intramuscular Given 11/13/15 2106)  azithromycin (ZITHROMAX) powder 1 g (1 g Oral Given 11/13/15 2112)  lidocaine (PF) (XYLOCAINE) 1 % injection (1.2 mLs  Given 11/13/15 2114)     Initial Impression / Assessment and Plan / ED Course  I have reviewed the triage vital signs and the nursing notes.  Pertinent labs & imaging results that were available during my care of the patient were reviewed by me and considered in my medical decision making (see chart for details).  Clinical Course  Patient in emergency department with bilateral leg and arm pain and weakness, and after a binge ingestion of Molly, alcohol, marijuana over the last 2 days. He denies any numbness. He was complaining of difficulty ambulating which he eventually said was due to pain in his legs. He denies any fever or chills. No other complaints at this time. Obtain lab work including CK level, all unremarkable. No elevation of white blood cell count. Kidney function is normal. CKs normal. Patient was given dose of Toradol IM, he was ambulated, no difficulty walking and this time. Patient also requested treatment due to STD exposure, was given Rocephin 250 mg IM and Zithromax on gram by mouth. Instructed to abstain from intercourse for a week. Follow-up with health department or primary care doctor.   Vitals:   11/13/15 1505 11/13/15 1506 11/13/15 1855 11/13/15 2118  BP: 117/74  120/74 126/76  Pulse: (!) 55  60 90  Resp:   20 18  Temp: 98.3 F (36.8 C)  97.5 F (36.4 C)   TempSrc: Oral  Oral   SpO2: 100%  100% 100%  Weight:  77.1 kg      Final Clinical Impressions(s) / ED Diagnoses   Final diagnoses:  Myalgia  Polysubstance abuse    New Prescriptions New  Prescriptions   No medications on file     Jaynie Crumbleatyana Iyania Denne, PA-C 11/14/15 0137    Jaynie Crumbleatyana Lakesia Dahle, PA-C 11/14/15 [id number]    Mancel BaleElliott Wentz, MD 11/14/15 2055

## 2015-11-13 NOTE — Discharge Instructions (Signed)
Your blood work today is normal. Make sure to drink plenty of fluids. Rest. Try heating pads to your legs. Follow up with your doctor as needed. Return if worsening symptoms.

## 2015-11-13 NOTE — ED Notes (Signed)
Patient states he has a hx of back pain.

## 2015-11-13 NOTE — ED Triage Notes (Addendum)
Pt stated that he took a "Molly " at 10pm last night. Pt stated that he is feeling weak and achy all over.Pt was "unable to walk from shower today". Friend called EMS. Pt is sleepy, but oriented and cooperative

## 2015-11-13 NOTE — ED Notes (Signed)
Pt ambulated in hall with c/o right leg pain.

## 2015-12-06 ENCOUNTER — Emergency Department (HOSPITAL_COMMUNITY)
Admission: EM | Admit: 2015-12-06 | Discharge: 2015-12-06 | Disposition: A | Discharge status: Home / Self Care | Payer: BLUE CROSS/BLUE SHIELD | Attending: Emergency Medicine | Admitting: Emergency Medicine

## 2015-12-06 ENCOUNTER — Encounter (HOSPITAL_COMMUNITY): Payer: Self-pay | Admitting: Vascular Surgery

## 2015-12-06 DIAGNOSIS — S50811A Abrasion of right forearm, initial encounter: Secondary | ICD-10-CM | POA: Insufficient documentation

## 2015-12-06 DIAGNOSIS — M5489 Other dorsalgia: Secondary | ICD-10-CM | POA: Diagnosis not present

## 2015-12-06 DIAGNOSIS — Y939 Activity, unspecified: Secondary | ICD-10-CM | POA: Diagnosis not present

## 2015-12-06 DIAGNOSIS — Y929 Unspecified place or not applicable: Secondary | ICD-10-CM | POA: Diagnosis not present

## 2015-12-06 DIAGNOSIS — X509XXA Other and unspecified overexertion or strenuous movements or postures, initial encounter: Secondary | ICD-10-CM | POA: Diagnosis not present

## 2015-12-06 DIAGNOSIS — Z23 Encounter for immunization: Secondary | ICD-10-CM | POA: Insufficient documentation

## 2015-12-06 DIAGNOSIS — S59911A Unspecified injury of right forearm, initial encounter: Secondary | ICD-10-CM | POA: Diagnosis present

## 2015-12-06 DIAGNOSIS — Z87891 Personal history of nicotine dependence: Secondary | ICD-10-CM | POA: Diagnosis not present

## 2015-12-06 DIAGNOSIS — Y999 Unspecified external cause status: Secondary | ICD-10-CM | POA: Insufficient documentation

## 2015-12-06 MED ORDER — BACITRACIN ZINC 500 UNIT/GM EX OINT
TOPICAL_OINTMENT | Freq: Two times a day (BID) | CUTANEOUS | Status: DC
  Administered 2015-12-06: 1 via TOPICAL
  Filled 2015-12-06: qty 0.9

## 2015-12-06 MED ORDER — ACETAMINOPHEN 500 MG PO TABS
1000.0000 mg | ORAL_TABLET | Freq: Once | ORAL | Status: AC
  Administered 2015-12-06: 1000 mg via ORAL
  Filled 2015-12-06: qty 2

## 2015-12-06 MED ORDER — TETANUS-DIPHTH-ACELL PERTUSSIS 5-2.5-18.5 LF-MCG/0.5 IM SUSP
0.5000 mL | Freq: Once | INTRAMUSCULAR | Status: AC
  Administered 2015-12-06: 0.5 mL via INTRAMUSCULAR
  Filled 2015-12-06: qty 0.5

## 2015-12-06 NOTE — Discharge Instructions (Signed)
Tylenol as directed for pain. Wash the abrasion on your right arm daily with soap and water and place a thin layer of bacitracin ointment over the wound. Signs of infection include redness around the wound, drainage from the wound more pain or fever. Call [facility] to get a primary care physician and to be seen if having significant pain by next week. Or you can see the urgent care center if your choice. [facility] Center takes walkins without an appointment on Thursdays.
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
## 2015-12-11 ENCOUNTER — Ambulatory Visit: Payer: BLUE CROSS/BLUE SHIELD | Attending: Internal Medicine | Admitting: Physician Assistant

## 2015-12-11 VITALS — BP 135/81 | HR 83 | Temp 98.5°F | Resp 16 | Ht 67.0 in | Wt 167.0 lb

## 2015-12-11 DIAGNOSIS — M5442 Lumbago with sciatica, left side: Secondary | ICD-10-CM

## 2015-12-11 DIAGNOSIS — M62838 Other muscle spasm: Secondary | ICD-10-CM | POA: Diagnosis not present

## 2015-12-11 LAB — POCT URINALYSIS DIPSTICK
Bilirubin, UA: NEGATIVE
GLUCOSE UA: NEGATIVE
Ketones, UA: NEGATIVE
NITRITE UA: NEGATIVE
Protein, UA: NEGATIVE
RBC UA: NEGATIVE
Spec Grav, UA: 1.02
UROBILINOGEN UA: 0.2
pH, UA: 6.5

## 2015-12-11 MED ORDER — METHOCARBAMOL 500 MG PO TABS: 500.0000 mg | ORAL_TABLET | Freq: Four times a day (QID) | ORAL | 0 refills | Status: DC

## 2015-12-11 MED ORDER — IBUPROFEN 800 MG PO TABS: 800.0000 mg | ORAL_TABLET | Freq: Three times a day (TID) | ORAL | 0 refills | Status: DC | PRN

## 2015-12-11 NOTE — Progress Notes (Signed)
Back pain since 9/3 r/t injury.  Taking Tylenol, ice, soaking, exercises
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
## 2015-12-11 NOTE — Patient Instructions (Signed)

## 2015-12-30 ENCOUNTER — Ambulatory Visit: Payer: BLUE CROSS/BLUE SHIELD | Admitting: Internal Medicine

## 2016-01-08 ENCOUNTER — Ambulatory Visit: Payer: BLUE CROSS/BLUE SHIELD | Admitting: Internal Medicine

## 2016-01-26 ENCOUNTER — Ambulatory Visit: Payer: BLUE CROSS/BLUE SHIELD | Admitting: Internal Medicine

## 2016-10-22 ENCOUNTER — Encounter (HOSPITAL_COMMUNITY): Payer: Self-pay | Admitting: Radiology

## 2016-10-22 ENCOUNTER — Encounter (HOSPITAL_COMMUNITY): Admission: EM | Disposition: A | Discharge status: Home / Self Care | Payer: Self-pay | Source: Home / Self Care

## 2016-10-22 ENCOUNTER — Inpatient Hospital Stay (HOSPITAL_COMMUNITY): Payer: BLUE CROSS/BLUE SHIELD | Admitting: Anesthesiology

## 2016-10-22 ENCOUNTER — Emergency Department (HOSPITAL_COMMUNITY): Payer: BLUE CROSS/BLUE SHIELD

## 2016-10-22 ENCOUNTER — Inpatient Hospital Stay (HOSPITAL_COMMUNITY)
Admission: EM | Admit: 2016-10-22 | Discharge: 2016-10-25 | DRG: 501 | Disposition: A | Discharge status: Home / Self Care | Payer: BLUE CROSS/BLUE SHIELD | Attending: General Surgery | Admitting: General Surgery

## 2016-10-22 DIAGNOSIS — S2232XA Fracture of one rib, left side, initial encounter for closed fracture: Secondary | ICD-10-CM | POA: Diagnosis present

## 2016-10-22 DIAGNOSIS — S51842A Puncture wound with foreign body of left forearm, initial encounter: Secondary | ICD-10-CM | POA: Diagnosis present

## 2016-10-22 DIAGNOSIS — S21132A Puncture wound without foreign body of left front wall of thorax without penetration into thoracic cavity, initial encounter: Secondary | ICD-10-CM | POA: Diagnosis present

## 2016-10-22 DIAGNOSIS — S52252B Displaced comminuted fracture of shaft of ulna, left arm, initial encounter for open fracture type I or II: Secondary | ICD-10-CM | POA: Diagnosis present

## 2016-10-22 DIAGNOSIS — W3400XA Accidental discharge from unspecified firearms or gun, initial encounter: Secondary | ICD-10-CM

## 2016-10-22 HISTORY — PX: ULNAR NERVE TRANSPOSITION: SHX2595

## 2016-10-22 HISTORY — PX: INCISE AND DRAIN ABCESS: PRO64

## 2016-10-22 HISTORY — PX: I&D EXTREMITY: SHX5045

## 2016-10-22 HISTORY — PX: FASCIOTOMY: SHX132

## 2016-10-22 HISTORY — DX: Accidental discharge from unspecified firearms or gun, initial encounter: W34.00XA

## 2016-10-22 HISTORY — PX: ORIF RADIAL FRACTURE: SHX5113

## 2016-10-22 LAB — PREPARE FRESH FROZEN PLASMA
UNIT DIVISION: 0
UNIT DIVISION: 0

## 2016-10-22 LAB — BPAM RBC
BLOOD PRODUCT EXPIRATION DATE: 201807252359
Blood Product Expiration Date: 201807252359
ISSUE DATE / TIME: 201807200017
ISSUE DATE / TIME: 201807200017
Unit Type and Rh: 9500
Unit Type and Rh: 9500

## 2016-10-22 LAB — TYPE AND SCREEN
ABO/RH(D): A POS
Antibody Screen: NEGATIVE
Unit division: 0
Unit division: 0

## 2016-10-22 LAB — BPAM FFP
BLOOD PRODUCT EXPIRATION DATE: 201808052359
Blood Product Expiration Date: 201807222359
ISSUE DATE / TIME: 201807200018
ISSUE DATE / TIME: 201807200018
UNIT TYPE AND RH: 6200
Unit Type and Rh: 6200

## 2016-10-22 LAB — CBC
HEMATOCRIT: 40.7 % (ref 39.0–52.0)
HEMOGLOBIN: 13.9 g/dL (ref 13.0–17.0)
MCH: 30.8 pg (ref 26.0–34.0)
MCHC: 34.2 g/dL (ref 30.0–36.0)
MCV: 90 fL (ref 78.0–100.0)
Platelets: 204 10*3/uL (ref 150–400)
RBC: 4.52 MIL/uL (ref 4.22–5.81)
RDW: 13 % (ref 11.5–15.5)
WBC: 12 10*3/uL — ABNORMAL HIGH (ref 4.0–10.5)

## 2016-10-22 LAB — COMPREHENSIVE METABOLIC PANEL
ALBUMIN: 4.5 g/dL (ref 3.5–5.0)
ALT: 30 U/L (ref 17–63)
AST: 41 U/L (ref 15–41)
Alkaline Phosphatase: 48 U/L (ref 38–126)
Anion gap: 21 — ABNORMAL HIGH (ref 5–15)
BILIRUBIN TOTAL: 0.9 mg/dL (ref 0.3–1.2)
BUN: 11 mg/dL (ref 6–20)
CALCIUM: 9.5 mg/dL (ref 8.9–10.3)
CO2: 15 mmol/L — ABNORMAL LOW (ref 22–32)
Chloride: 106 mmol/L (ref 101–111)
Creatinine, Ser: 1.63 mg/dL — ABNORMAL HIGH (ref 0.61–1.24)
GFR calc Af Amer: >60 mL/min (ref >60)
GFR calc non Af Amer: 59 mL/min — ABNORMAL LOW (ref >60)
GLUCOSE: 103 mg/dL — AB (ref 65–99)
POTASSIUM: 3.6 mmol/L (ref 3.5–5.1)
Sodium: 142 mmol/L (ref 135–145)
Total Protein: 7 g/dL (ref 6.5–8.1)

## 2016-10-22 LAB — BASIC METABOLIC PANEL
Anion gap: 7 (ref 5–15)
BUN: 8 mg/dL (ref 6–20)
CHLORIDE: 109 mmol/L (ref 101–111)
CO2: 21 mmol/L — AB (ref 22–32)
CREATININE: 1.1 mg/dL (ref 0.61–1.24)
Calcium: 8.7 mg/dL — ABNORMAL LOW (ref 8.9–10.3)
GFR calc Af Amer: >60 mL/min (ref >60)
GFR calc non Af Amer: >60 mL/min (ref >60)
GLUCOSE: 155 mg/dL — AB (ref 65–99)
Potassium: 4.2 mmol/L (ref 3.5–5.1)
Sodium: 137 mmol/L (ref 135–145)

## 2016-10-22 LAB — I-STAT CHEM 8, ED
BUN: 14 mg/dL (ref 6–20)
CHLORIDE: 107 mmol/L (ref 101–111)
Calcium, Ion: 1.1 mmol/L — ABNORMAL LOW (ref 1.15–1.40)
Creatinine, Ser: 1.3 mg/dL — ABNORMAL HIGH (ref 0.61–1.24)
Glucose, Bld: 97 mg/dL (ref 65–99)
HEMATOCRIT: 48 % (ref 39.0–52.0)
HEMOGLOBIN: 16.3 g/dL (ref 13.0–17.0)
POTASSIUM: 3.6 mmol/L (ref 3.5–5.1)
SODIUM: 143 mmol/L (ref 135–145)
TCO2: 16 mmol/L (ref 0–100)

## 2016-10-22 LAB — CBC WITH DIFFERENTIAL/PLATELET
BASOS PCT: 0 %
Basophils Absolute: 0 10*3/uL (ref 0.0–0.1)
EOS ABS: 0.1 10*3/uL (ref 0.0–0.7)
Eosinophils Relative: 2 %
HEMATOCRIT: 45 % (ref 39.0–52.0)
HEMOGLOBIN: 14.5 g/dL (ref 13.0–17.0)
Lymphocytes Relative: 56 %
Lymphs Abs: 5.2 10*3/uL — ABNORMAL HIGH (ref 0.7–4.0)
MCH: 29.6 pg (ref 26.0–34.0)
MCHC: 32.2 g/dL (ref 30.0–36.0)
MCV: 91.8 fL (ref 78.0–100.0)
MONOS PCT: 6 %
Monocytes Absolute: 0.6 10*3/uL (ref 0.1–1.0)
NEUTROS ABS: 3.3 10*3/uL (ref 1.7–7.7)
NEUTROS PCT: 36 %
Platelets: 230 10*3/uL (ref 150–400)
RBC: 4.9 MIL/uL (ref 4.22–5.81)
RDW: 13.1 % (ref 11.5–15.5)
WBC: 9.2 10*3/uL (ref 4.0–10.5)

## 2016-10-22 LAB — ETHANOL

## 2016-10-22 LAB — HIV ANTIBODY (ROUTINE TESTING W REFLEX): HIV Screen 4th Generation wRfx: NONREACTIVE

## 2016-10-22 LAB — I-STAT CG4 LACTIC ACID, ED: LACTIC ACID, VENOUS: 12.14 mmol/L — AB (ref 0.5–1.9)

## 2016-10-22 LAB — BLOOD PRODUCT ORDER (VERBAL) VERIFICATION

## 2016-10-22 LAB — ABO/RH: ABO/RH(D): A POS

## 2016-10-22 SURGERY — IRRIGATION AND DEBRIDEMENT EXTREMITY
Anesthesia: General | Site: Arm Lower | Laterality: Left

## 2016-10-22 SURGERY — IRRIGATION AND DEBRIDEMENT EXTREMITY
Anesthesia: General | Laterality: Left

## 2016-10-22 MED ORDER — ENOXAPARIN SODIUM 40 MG/0.4ML ~~LOC~~ SOLN
40.0000 mg | SUBCUTANEOUS | Status: DC
  Administered 2016-10-22 – 2016-10-24 (×3): 40 mg via SUBCUTANEOUS
  Filled 2016-10-22 (×3): qty 0.4

## 2016-10-22 MED ORDER — SUCCINYLCHOLINE CHLORIDE 20 MG/ML IJ SOLN
INTRAMUSCULAR | Status: DC | PRN
  Administered 2016-10-22: 140 mg via INTRAVENOUS

## 2016-10-22 MED ORDER — KCL IN DEXTROSE-NACL 20-5-0.45 MEQ/L-%-% IV SOLN
INTRAVENOUS | Status: DC
  Administered 2016-10-22 (×2): via INTRAVENOUS
  Filled 2016-10-22 (×3): qty 1000

## 2016-10-22 MED ORDER — FENTANYL CITRATE (PF) 100 MCG/2ML IJ SOLN
INTRAMUSCULAR | Status: AC
  Administered 2016-10-22: 50 ug via INTRAVENOUS
  Filled 2016-10-22: qty 2

## 2016-10-22 MED ORDER — HYDROMORPHONE HCL 1 MG/ML IJ SOLN
0.2500 mg | INTRAMUSCULAR | Status: DC | PRN
  Administered 2016-10-22 (×3): 0.5 mg via INTRAVENOUS

## 2016-10-22 MED ORDER — LIDOCAINE HCL (CARDIAC) 20 MG/ML IV SOLN
INTRAVENOUS | Status: AC
  Filled 2016-10-22: qty 5

## 2016-10-22 MED ORDER — LACTATED RINGERS IV SOLN
INTRAVENOUS | Status: DC | PRN
  Administered 2016-10-22: 03:00:00 via INTRAVENOUS

## 2016-10-22 MED ORDER — PANTOPRAZOLE SODIUM 40 MG IV SOLR: 40.0000 mg | Freq: Every day | INTRAVENOUS | Status: DC

## 2016-10-22 MED ORDER — SODIUM CHLORIDE 0.9 % IV SOLN
INTRAVENOUS | Status: DC | PRN
  Administered 2016-10-22: 04:00:00 via INTRAVENOUS

## 2016-10-22 MED ORDER — MEPERIDINE HCL 25 MG/ML IJ SOLN: 6.2500 mg | INTRAMUSCULAR | Status: DC | PRN

## 2016-10-22 MED ORDER — HYDROMORPHONE HCL 1 MG/ML IJ SOLN
INTRAMUSCULAR | Status: AC
  Filled 2016-10-22: qty 0.5

## 2016-10-22 MED ORDER — PANTOPRAZOLE SODIUM 40 MG PO TBEC
40.0000 mg | DELAYED_RELEASE_TABLET | Freq: Every day | ORAL | Status: DC
  Administered 2016-10-22 – 2016-10-25 (×4): 40 mg via ORAL
  Filled 2016-10-22 (×5): qty 1

## 2016-10-22 MED ORDER — CEFAZOLIN SODIUM-DEXTROSE 1-4 GM/50ML-% IV SOLN
INTRAVENOUS | Status: DC | PRN
  Administered 2016-10-22: 2 g via INTRAVENOUS

## 2016-10-22 MED ORDER — ACETAMINOPHEN 325 MG PO TABS
650.0000 mg | ORAL_TABLET | ORAL | Status: DC | PRN
  Administered 2016-10-23: 650 mg via ORAL
  Filled 2016-10-22 (×2): qty 2

## 2016-10-22 MED ORDER — PROPOFOL 10 MG/ML IV BOLUS
INTRAVENOUS | Status: DC | PRN
  Administered 2016-10-22: 120 mg via INTRAVENOUS

## 2016-10-22 MED ORDER — DEXAMETHASONE SODIUM PHOSPHATE 10 MG/ML IJ SOLN
INTRAMUSCULAR | Status: DC | PRN
  Administered 2016-10-22: 10 mg via INTRAVENOUS

## 2016-10-22 MED ORDER — CEFAZOLIN SODIUM-DEXTROSE 1-4 GM/50ML-% IV SOLN
1.0000 g | Freq: Three times a day (TID) | INTRAVENOUS | Status: DC
  Administered 2016-10-22 – 2016-10-25 (×9): 1 g via INTRAVENOUS
  Filled 2016-10-22 (×10): qty 50

## 2016-10-22 MED ORDER — ONDANSETRON 4 MG PO TBDP
4.0000 mg | ORAL_TABLET | Freq: Four times a day (QID) | ORAL | Status: DC | PRN
  Filled 2016-10-22: qty 1

## 2016-10-22 MED ORDER — FENTANYL CITRATE (PF) 100 MCG/2ML IJ SOLN
INTRAMUSCULAR | Status: DC | PRN
  Administered 2016-10-22: 150 ug via INTRAVENOUS
  Administered 2016-10-22: 100 ug via INTRAVENOUS

## 2016-10-22 MED ORDER — FENTANYL CITRATE (PF) 250 MCG/5ML IJ SOLN
INTRAMUSCULAR | Status: AC
  Filled 2016-10-22: qty 5

## 2016-10-22 MED ORDER — CEFAZOLIN SODIUM-DEXTROSE 1-4 GM/50ML-% IV SOLN
1.0000 g | Freq: Three times a day (TID) | INTRAVENOUS | Status: DC
  Filled 2016-10-22 (×2): qty 50

## 2016-10-22 MED ORDER — 0.9 % SODIUM CHLORIDE (POUR BTL) OPTIME
TOPICAL | Status: DC | PRN
  Administered 2016-10-22: 1000 mL

## 2016-10-22 MED ORDER — ONDANSETRON HCL 4 MG/2ML IJ SOLN
4.0000 mg | Freq: Four times a day (QID) | INTRAMUSCULAR | Status: DC | PRN
  Administered 2016-10-22: 4 mg via INTRAVENOUS
  Filled 2016-10-22: qty 2

## 2016-10-22 MED ORDER — HYDROMORPHONE HCL 1 MG/ML IJ SOLN
1.0000 mg | INTRAMUSCULAR | Status: DC | PRN
  Administered 2016-10-22 – 2016-10-24 (×13): 1 mg via INTRAVENOUS
  Filled 2016-10-22 (×14): qty 1

## 2016-10-22 MED ORDER — ONDANSETRON HCL 4 MG/2ML IJ SOLN
INTRAMUSCULAR | Status: AC
  Filled 2016-10-22: qty 2

## 2016-10-22 MED ORDER — DEXAMETHASONE SODIUM PHOSPHATE 10 MG/ML IJ SOLN
INTRAMUSCULAR | Status: AC
  Filled 2016-10-22: qty 1

## 2016-10-22 MED ORDER — OXYCODONE HCL 5 MG PO TABS
10.0000 mg | ORAL_TABLET | ORAL | Status: DC | PRN
  Administered 2016-10-22 – 2016-10-25 (×16): 10 mg via ORAL
  Filled 2016-10-22 (×15): qty 2

## 2016-10-22 MED ORDER — LIDOCAINE HCL (CARDIAC) 20 MG/ML IV SOLN
INTRAVENOUS | Status: DC | PRN
  Administered 2016-10-22: 100 mg via INTRAVENOUS

## 2016-10-22 MED ORDER — MIDAZOLAM HCL 2 MG/2ML IJ SOLN
INTRAMUSCULAR | Status: AC
  Filled 2016-10-22: qty 2

## 2016-10-22 MED ORDER — MIDAZOLAM HCL 5 MG/5ML IJ SOLN
INTRAMUSCULAR | Status: DC | PRN
  Administered 2016-10-22: 2 mg via INTRAVENOUS

## 2016-10-22 MED ORDER — ONDANSETRON HCL 4 MG/2ML IJ SOLN: 4.0000 mg | Freq: Once | INTRAMUSCULAR | Status: DC | PRN

## 2016-10-22 MED ORDER — HYDROMORPHONE HCL 1 MG/ML IJ SOLN
INTRAMUSCULAR | Status: AC
  Filled 2016-10-22: qty 1

## 2016-10-22 MED ORDER — SODIUM CHLORIDE 0.9 % IR SOLN
Status: DC | PRN
  Administered 2016-10-22 (×2): 3000 mL

## 2016-10-22 MED ORDER — ONDANSETRON HCL 4 MG/2ML IJ SOLN
INTRAMUSCULAR | Status: DC | PRN
  Administered 2016-10-22: 4 mg via INTRAVENOUS

## 2016-10-22 MED ORDER — FENTANYL CITRATE (PF) 100 MCG/2ML IJ SOLN
50.0000 ug | Freq: Once | INTRAMUSCULAR | Status: AC
  Administered 2016-10-22: 50 ug via INTRAVENOUS

## 2016-10-22 MED ORDER — HYDROMORPHONE HCL 1 MG/ML IJ SOLN: 0.5000 mg | INTRAMUSCULAR | Status: DC | PRN

## 2016-10-22 MED ORDER — IOPAMIDOL (ISOVUE-300) INJECTION 61%
100.0000 mL | Freq: Once | INTRAVENOUS | Status: AC | PRN
  Administered 2016-10-22: 100 mL via INTRAVENOUS

## 2016-10-22 MED ORDER — VITAMIN C 500 MG PO TABS
1000.0000 mg | ORAL_TABLET | Freq: Every day | ORAL | Status: DC
  Administered 2016-10-22 – 2016-10-25 (×4): 1000 mg via ORAL
  Filled 2016-10-22 (×5): qty 2

## 2016-10-22 MED ORDER — PROPOFOL 10 MG/ML IV BOLUS
INTRAVENOUS | Status: AC
  Filled 2016-10-22: qty 20

## 2016-10-22 MED ORDER — SODIUM CHLORIDE 0.9 % IV SOLN
Freq: Once | INTRAVENOUS | Status: AC
  Administered 2016-10-22: 01:00:00 via INTRAVENOUS

## 2016-10-22 MED ORDER — SUCCINYLCHOLINE CHLORIDE 200 MG/10ML IV SOSY
PREFILLED_SYRINGE | INTRAVENOUS | Status: AC
  Filled 2016-10-22: qty 10

## 2016-10-22 MED ORDER — OXYCODONE HCL 5 MG PO TABS
5.0000 mg | ORAL_TABLET | ORAL | Status: DC | PRN
  Filled 2016-10-22 (×2): qty 1

## 2016-10-22 SURGICAL SUPPLY — 58 items
BANDAGE ACE 4X5 VEL STRL LF (GAUZE/BANDAGES/DRESSINGS) ×3 IMPLANT
BIT DRILL 2.5X2.75 QC CALB (BIT) ×2 IMPLANT
BIT DRILL 2.5X205 (BIT) IMPLANT
BNDG CONFORM 2 STRL LF (GAUZE/BANDAGES/DRESSINGS) IMPLANT
BNDG GAUZE ELAST 4 BULKY (GAUZE/BANDAGES/DRESSINGS) ×9 IMPLANT
CORDS BIPOLAR (ELECTRODE) ×3 IMPLANT
CUFF TOURNIQUET SINGLE 18IN (TOURNIQUET CUFF) ×3 IMPLANT
CUFF TOURNIQUET SINGLE 24IN (TOURNIQUET CUFF) IMPLANT
DRILL BIT 2.5X205 (BIT) ×3
DRSG ADAPTIC 3X8 NADH LF (GAUZE/BANDAGES/DRESSINGS) ×3 IMPLANT
GAUZE SPONGE 4X4 12PLY STRL (GAUZE/BANDAGES/DRESSINGS) ×3 IMPLANT
GAUZE XEROFORM 1X8 LF (GAUZE/BANDAGES/DRESSINGS) ×3 IMPLANT
GAUZE XEROFORM 5X9 LF (GAUZE/BANDAGES/DRESSINGS) ×2 IMPLANT
GLOVE BIOGEL M 8.0 STRL (GLOVE) ×3 IMPLANT
GLOVE SS BIOGEL STRL SZ 8 (GLOVE) ×1 IMPLANT
GLOVE SUPERSENSE BIOGEL SZ 8 (GLOVE) ×2
GOWN STRL REUS W/ TWL LRG LVL3 (GOWN DISPOSABLE) ×1 IMPLANT
GOWN STRL REUS W/ TWL XL LVL3 (GOWN DISPOSABLE) ×2 IMPLANT
GOWN STRL REUS W/TWL LRG LVL3 (GOWN DISPOSABLE) ×3
GOWN STRL REUS W/TWL XL LVL3 (GOWN DISPOSABLE) ×6
HANDPIECE INTERPULSE COAX TIP (DISPOSABLE)
KIT BASIN OR (CUSTOM PROCEDURE TRAY) ×3 IMPLANT
KIT ROOM TURNOVER OR (KITS) ×3 IMPLANT
MANIFOLD NEPTUNE II (INSTRUMENTS) ×3 IMPLANT
NDL HYPO 25GX1X1/2 BEV (NEEDLE) IMPLANT
NEEDLE HYPO 25GX1X1/2 BEV (NEEDLE) IMPLANT
NS IRRIG 1000ML POUR BTL (IV SOLUTION) ×3 IMPLANT
PACK ORTHO EXTREMITY (CUSTOM PROCEDURE TRAY) ×3 IMPLANT
PAD ARMBOARD 7.5X6 YLW CONV (MISCELLANEOUS) ×3 IMPLANT
PAD CAST 4YDX4 CTTN HI CHSV (CAST SUPPLIES) ×1 IMPLANT
PADDING CAST COTTON 4X4 STRL (CAST SUPPLIES) ×3
PLATE 9HOLE DUAL COMPRESSION (Plate) ×2 IMPLANT
PLATE BONE DUAL COMP 14H 183MM (Plate) ×2 IMPLANT
SCREW CORT 2.5X20X3.5XST SM (Screw) IMPLANT
SCREW CORT 2.5X24X3.5XST SM (Screw) IMPLANT
SCREW CORT S/T 3.5X22 (Screw) ×2 IMPLANT
SCREW CORTICAL 3.5 16MM (Screw) ×4 IMPLANT
SCREW CORTICAL 3.5 18MM (Screw) ×8 IMPLANT
SCREW CORTICAL 3.5X20 (Screw) ×9 IMPLANT
SCREW CORTICAL 3.5X24MM (Screw) ×3 IMPLANT
SCREW CORTICAL 3.5X26 (Screw) ×2 IMPLANT
SCRUB BETADINE 4OZ XXX (MISCELLANEOUS) ×3 IMPLANT
SET HNDPC FAN SPRY TIP SCT (DISPOSABLE) IMPLANT
SOL PREP POV-IOD 4OZ 10% (MISCELLANEOUS) ×3 IMPLANT
SPLINT FIBERGLASS 4X30 (CAST SUPPLIES) ×2 IMPLANT
SPONGE LAP 4X18 X RAY DECT (DISPOSABLE) ×3 IMPLANT
SUT PROLENE 3 0 PS 2 (SUTURE) ×4 IMPLANT
SUT VIC AB 2-0 CT1 27 (SUTURE) ×3
SUT VIC AB 2-0 CT1 TAPERPNT 27 (SUTURE) IMPLANT
SUT VIC AB 3-0 FS2 27 (SUTURE) ×2 IMPLANT
SWAB CULTURE ESWAB REG 1ML (MISCELLANEOUS) IMPLANT
SYR CONTROL 10ML LL (SYRINGE) IMPLANT
TOWEL OR 17X24 6PK STRL BLUE (TOWEL DISPOSABLE) ×3 IMPLANT
TOWEL OR 17X26 10 PK STRL BLUE (TOWEL DISPOSABLE) ×3 IMPLANT
TUBE CONNECTING 12'X1/4 (SUCTIONS) ×1
TUBE CONNECTING 12X1/4 (SUCTIONS) ×2 IMPLANT
WATER STERILE IRR 1000ML POUR (IV SOLUTION) ×3 IMPLANT
YANKAUER SUCT BULB TIP NO VENT (SUCTIONS) ×3 IMPLANT
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
## 2016-10-22 NOTE — ED Notes (Signed)
To CT at this time with RN, continuous monitoring

## 2016-10-22 NOTE — Anesthesia Preprocedure Evaluation (Signed)
Anesthesia Evaluation  Patient identified by MRN, date of birth, ID band Patient awake    Reviewed: Allergy & Precautions, NPO status , Patient's Chart, lab work & pertinent test results  Airway Mallampati: I  TM Distance: >3 FB Neck ROM: Full    Dental   Pulmonary    Pulmonary exam normal        Cardiovascular Normal cardiovascular exam     Neuro/Psych    GI/Hepatic   Endo/Other    Renal/GU      Musculoskeletal   Abdominal   Peds  Hematology   Anesthesia Other Findings   Reproductive/Obstetrics                             Anesthesia Physical Anesthesia Plan  ASA: II  Anesthesia Plan: General   Post-op Pain Management:    Induction: Intravenous, Rapid sequence and Cricoid pressure planned  PONV Risk Score and Plan: 2 and Ondansetron and Dexamethasone  Airway Management Planned: Oral ETT  Additional Equipment:   Intra-op Plan:   Post-operative Plan: Extubation in OR  Informed Consent: I have reviewed the patients History and Physical, chart, labs and discussed the procedure including the risks, benefits and alternatives for the proposed anesthesia with the patient or authorized representative who has indicated his/her understanding and acceptance.     Plan Discussed with: CRNA and Surgeon  Anesthesia Plan Comments:         Anesthesia Quick Evaluation
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
## 2016-10-22 NOTE — ED Notes (Signed)
Pt returned to TRA B from CT

## 2016-10-22 NOTE — Anesthesia Procedure Notes (Signed)
Procedure Name: Intubation Date/Time: 10/22/2016 3:37 AM Performed by: Manuela Schwartz B Pre-anesthesia Checklist: Patient identified, Emergency Drugs available, Suction available, Patient being monitored and Timeout performed Patient Re-evaluated:Patient Re-evaluated prior to induction Oxygen Delivery Method: Circle system utilized Preoxygenation: Pre-oxygenation with 100% oxygen Induction Type: IV induction, Rapid sequence and Cricoid Pressure applied Laryngoscope Size: Mac and 3 Grade View: Grade I Tube type: Oral Tube size: 7.5 mm Number of attempts: 1 Airway Equipment and Method: Stylet Placement Confirmation: ETT inserted through vocal cords under direct vision,  positive ETCO2 and breath sounds checked- equal and bilateral Secured at: 22 cm Tube secured with: Tape Dental Injury: Teeth and Oropharynx as per pre-operative assessment
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
## 2016-10-22 NOTE — Transfer of Care (Signed)
Immediate Anesthesia Transfer of Care Note  Patient: Kurt Thornton  Procedure(s) Performed: Procedure(s): IRRIGATION AND DEBRIDEMENT left forearm (Left) OPEN REDUCTION INTERNAL FIXATION (ORIF) RADIAL FRACTURE (Left) ULNAR NERVE DECOMPRESSION/TRANSPOSITION (Left) FASCIOTOMY (Left)  Patient Location: PACU  Anesthesia Type:General  Level of Consciousness: awake, alert  and oriented  Airway & Oxygen Therapy: Patient Spontanous Breathing  Post-op Assessment: Report given to RN and Post -op Vital signs reviewed and stable  Post vital signs: Reviewed and stable  Last Vitals:  Vitals:   10/22/16 0230 10/22/16 0259  BP: (!) 144/86 (!) 144/79  Pulse: 63 75  Resp: (!) 25 20  Temp:  37.2 C    Last Pain:  Vitals:   10/22/16 0259  TempSrc: Oral  PainSc:          Complications: No apparent anesthesia complications

## 2016-10-22 NOTE — Progress Notes (Signed)
Called to leave message on aunts phone to notify taking pt to 5[address]04 mailbox full [phone] left message with vivki volunteer

## 2016-10-22 NOTE — Progress Notes (Signed)
Orthopedic Tech Progress Note Patient Details:  Kurt Thornton [name] 1996-01-18 [id number]  Ortho Devices Type of Ortho Device: Short arm splint Ortho Device/Splint Location: applied short arm splint to pt left arm.  pt tolerated application very well.  left arm.  Ortho Device/Splint Interventions: Application   [name], Marielis Samara C 10/22/2016, 2:31 AM
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
## 2016-10-22 NOTE — ED Notes (Signed)
Splint placed by ortho tech
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
## 2016-10-22 NOTE — ED Notes (Signed)
Pt to 5N with EMT

## 2016-10-23 MED ORDER — ACETAMINOPHEN 325 MG PO TABS
650.0000 mg | ORAL_TABLET | Freq: Four times a day (QID) | ORAL | Status: DC
  Administered 2016-10-23 – 2016-10-25 (×9): 650 mg via ORAL
  Filled 2016-10-23 (×9): qty 2
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
## 2016-10-24 MED ORDER — BISACODYL 10 MG RE SUPP: 10.0000 mg | Freq: Every day | RECTAL | Status: DC | PRN

## 2016-10-24 MED ORDER — POLYETHYLENE GLYCOL 3350 17 G PO PACK
17.0000 g | PACK | Freq: Every day | ORAL | Status: DC
  Administered 2016-10-24 – 2016-10-25 (×2): 17 g via ORAL
  Filled 2016-10-24 (×2): qty 1

## 2016-10-24 MED ORDER — CEPHALEXIN 500 MG PO CAPS: 500.0000 mg | ORAL_CAPSULE | Freq: Four times a day (QID) | ORAL | 0 refills | Status: DC

## 2016-10-24 MED ORDER — SENNOSIDES-DOCUSATE SODIUM 8.6-50 MG PO TABS
1.0000 | ORAL_TABLET | Freq: Two times a day (BID) | ORAL | Status: DC
  Administered 2016-10-24 – 2016-10-25 (×2): 1 via ORAL
  Filled 2016-10-24 (×2): qty 1

## 2016-10-24 MED ORDER — HYDROMORPHONE HCL 1 MG/ML IJ SOLN
0.5000 mg | INTRAMUSCULAR | Status: DC | PRN
  Administered 2016-10-24 – 2016-10-25 (×3): 0.5 mg via INTRAVENOUS
  Filled 2016-10-24 (×3): qty 1

## 2016-10-24 MED ORDER — DIPHENHYDRAMINE HCL 25 MG PO CAPS
25.0000 mg | ORAL_CAPSULE | Freq: Four times a day (QID) | ORAL | Status: DC | PRN
  Administered 2016-10-24 (×3): 25 mg via ORAL
  Filled 2016-10-24 (×3): qty 1

## 2016-10-24 MED ORDER — OXYCODONE HCL 10 MG PO TABS: 10.0000 mg | ORAL_TABLET | ORAL | 0 refills | Status: DC | PRN

## 2016-10-24 NOTE — Discharge Instructions (Signed)
Dry dressing daily and then as needed to left chest Blanda.    Cast or Splint Care, Adult Casts and splints are supports that are worn to protect broken bones and other injuries. A cast or splint may hold a bone still and in the correct position while it heals. Casts and splints may also help ease pain, swelling, and muscle spasms. A cast is a hardened support that is usually made of fiberglass or plaster. It is custom-fit to the body and it offers more protection than a splint. It cannot be taken off and put back on. A splint is a type of soft support that is usually made from cloth and elastic. It can be adjusted or taken off as needed. You may need a cast or a splint if you:  Have a broken bone.  Have a soft-tissue injury.  Need to keep an injured body part from moving (keep it immobile) after surgery.  How is this treated? If you have a cast:  Do not stick anything inside the cast to scratch your skin. Sticking something in the cast increases your risk of infection.  Check the skin around the cast every day. Tell your health care provider about any concerns.  You may put lotion on dry skin around the edges of the cast. Do not put lotion on the skin underneath the cast.  Keep the cast clean.  If the cast is not waterproof: ? Do not let it get wet. ? Cover it with a watertight covering when you take a bath or a shower. If you have a splint:  Wear it as told by your health care provider. Remove it only as told by your health care provider.  Loosen the splint if your fingers or toes tingle, become numb, or turn cold and blue.  Keep the splint clean.  If the splint is not waterproof: ? Do not let it get wet. ? Cover it with a watertight covering when you take a bath or a shower. Bathing  Do not take baths or swim until your health care provider approves. Ask your health care provider if you can take showers. You may only be allowed to take sponge baths for bathing.  If your  cast or splint is not waterproof, cover it with a watertight covering when you take a bath or shower. Managing pain, stiffness, and swelling  Move your fingers or toes often to avoid stiffness and to lessen swelling.  Raise (elevate) the injured area above the level of your heart while sitting or lying down. Safety  Do not use the injured limb to support your body weight until your health care provider says that it is okay.  Use crutches or other assistive devices as told by your health care provider. General instructions  Do not put pressure on any part of the cast or splint until it is fully hardened. This may take several hours.  Return to your normal activities as told by your health care provider. Ask your health care provider what activities are safe for you.  Take over-the-counter and prescription medicines only as told by your health care provider.  Keep all follow-up visits as told by your health care provider. This is important. Contact a health care provider if:  Your cast or splint gets damaged.  The skin around the cast gets red or raw.  The skin under the cast is extremely itchy or painful.  Your cast or splint feels very uncomfortable.  Your cast or splint is  too tight or too loose.  Your cast becomes wet or it develops a soft spot or area.  You get an object stuck under your cast. Get help right away if:  Your pain is getting worse.  The injured area tingles, becomes numb, or turns cold and blue.  The part of your body above or below the cast is swollen and discolored.  You cannot feel or move your fingers or toes.  There is fluid leaking through the cast.  You have severe pain or pressure under the cast.  You have trouble breathing.  You have shortness of breath.  You have chest pain. This information is not intended to replace advice given to you by your health care provider. Make sure you discuss any questions you have with your health care  provider. Document Released: 03/19/2000 Document Revised: 10/11/2015 Document Reviewed: 09/13/2015 Elsevier Interactive Patient Education  [employer].
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
## 2016-10-24 NOTE — Progress Notes (Signed)
Patient continues to complain of pain to left arm, rating 10/10 each time. Patient has received oxycodone and IV dilaudid three times each on my shift. Notified Dr. Donell BeersByerly that patient does not feel his pain is controlled and does not feel he can go home today. Discharge order discontinued. Will continue to monitor patient. Gildardo CrankerAnita Priddy, RN

## 2016-10-25 ENCOUNTER — Encounter (HOSPITAL_COMMUNITY): Payer: Self-pay | Admitting: Orthopedic Surgery

## 2016-10-25 MED ORDER — CEPHALEXIN 500 MG PO CAPS: 500.0000 mg | ORAL_CAPSULE | Freq: Four times a day (QID) | ORAL | 0 refills | Status: DC

## 2016-10-25 MED ORDER — CEPHALEXIN 500 MG PO CAPS: 500.0000 mg | ORAL_CAPSULE | Freq: Four times a day (QID) | ORAL | Status: DC

## 2016-10-25 MED ORDER — OXYCODONE HCL 5 MG PO TABS: 5.0000 mg | ORAL_TABLET | ORAL | 0 refills | Status: DC | PRN

## 2016-10-25 NOTE — Progress Notes (Signed)
Patient ID: [name] [name], male   DOB: Nov 21, 1995, 21 y.o.   MRN: [id number] This Patient's record was reviewed on the [place] Controlled Substance Database Violeta GelinasBurke Cailee Blanke, MD, MPH, FACS Trauma: [phone] General Surgery: [phone]

## 2016-10-25 NOTE — Progress Notes (Signed)
Patient ID: Kurt Thornton, male   DOB: 19-Oct-1995, 21 y.o.   MRN: [id number] Patient is alert and oriented and complains of moderate pain in his arm on an intermittent basis.  I've carefully examined the patient. He has normal sensation normal motor function and no signs of infection compartment syndrome or other complicating feature. I once again explained the operation to him in detail. I'll plan to see him back in my office in 10-14 days for follow-up. He understands to call the office for follow-up. I stressed the importance of elevation, no smoking, healthy living habitus, and flexion and extension as well as intrinsic function of the hand. Overall I think he looks quite well.  I stressed the importance of getting off the pain medicine.  He was examined at length in regards to the left arm and I do think this is stable for discharge once again.  Patient is amenable to this. He understands the importance of proper follow-up and postop care plan to be adhered to.  I would DC him on Keflex 500 mg 4 times a day 7 days  All questions were encouraged and answered  Kenyata Napier M.D.
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
## 2016-10-31 ENCOUNTER — Encounter (HOSPITAL_COMMUNITY): Payer: Self-pay | Admitting: Emergency Medicine

## 2016-10-31 ENCOUNTER — Emergency Department (HOSPITAL_COMMUNITY)
Admission: EM | Admit: 2016-10-31 | Discharge: 2016-10-31 | Disposition: A | Discharge status: Home / Self Care | Payer: BLUE CROSS/BLUE SHIELD | Attending: Emergency Medicine | Admitting: Emergency Medicine

## 2016-10-31 ENCOUNTER — Emergency Department (HOSPITAL_COMMUNITY): Payer: BLUE CROSS/BLUE SHIELD

## 2016-10-31 DIAGNOSIS — M79602 Pain in left arm: Secondary | ICD-10-CM | POA: Diagnosis not present

## 2016-10-31 DIAGNOSIS — G8918 Other acute postprocedural pain: Secondary | ICD-10-CM | POA: Insufficient documentation

## 2016-10-31 DIAGNOSIS — F17210 Nicotine dependence, cigarettes, uncomplicated: Secondary | ICD-10-CM | POA: Insufficient documentation

## 2016-10-31 MED ORDER — MELOXICAM 15 MG PO TABS: 15.0000 mg | ORAL_TABLET | Freq: Every day | ORAL | 0 refills | Status: DC
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
## 2016-10-31 NOTE — Progress Notes (Signed)
Orthopedic Tech Progress Note Patient Details:  Kurt Thornton 12/07/1995 [id number]  Ortho Devices Type of Ortho Device: Arm sling, Ace wrap, Long arm splint   Kurt Thornton 10/31/2016, 7:07 AM

## 2016-10-31 NOTE — ED Notes (Signed)
Ortho notified of need for long arm splint.  

## 2017-04-28 ENCOUNTER — Encounter (HOSPITAL_COMMUNITY): Payer: Self-pay | Admitting: Emergency Medicine

## 2017-04-28 ENCOUNTER — Emergency Department (HOSPITAL_COMMUNITY)
Admission: EM | Admit: 2017-04-28 | Discharge: 2017-04-28 | Disposition: A | Discharge status: Home / Self Care | Payer: BLUE CROSS/BLUE SHIELD | Attending: Emergency Medicine | Admitting: Emergency Medicine

## 2017-04-28 ENCOUNTER — Ambulatory Visit (HOSPITAL_COMMUNITY)
Admission: EM | Admit: 2017-04-28 | Discharge: 2017-04-28 | Disposition: A | Discharge status: Home / Self Care | Payer: BLUE CROSS/BLUE SHIELD | Source: Home / Self Care | Attending: Family Medicine | Admitting: Family Medicine

## 2017-04-28 ENCOUNTER — Other Ambulatory Visit: Payer: Self-pay

## 2017-04-28 ENCOUNTER — Emergency Department (HOSPITAL_COMMUNITY): Payer: BLUE CROSS/BLUE SHIELD

## 2017-04-28 DIAGNOSIS — S0990XA Unspecified injury of head, initial encounter: Secondary | ICD-10-CM | POA: Diagnosis not present

## 2017-04-28 DIAGNOSIS — Z79899 Other long term (current) drug therapy: Secondary | ICD-10-CM | POA: Insufficient documentation

## 2017-04-28 DIAGNOSIS — F319 Bipolar disorder, unspecified: Secondary | ICD-10-CM | POA: Insufficient documentation

## 2017-04-28 DIAGNOSIS — Y9389 Activity, other specified: Secondary | ICD-10-CM | POA: Insufficient documentation

## 2017-04-28 DIAGNOSIS — X58XXXA Exposure to other specified factors, initial encounter: Secondary | ICD-10-CM | POA: Diagnosis not present

## 2017-04-28 DIAGNOSIS — Y92410 Unspecified street and highway as the place of occurrence of the external cause: Secondary | ICD-10-CM | POA: Insufficient documentation

## 2017-04-28 DIAGNOSIS — S060X1A Concussion with loss of consciousness of 30 minutes or less, initial encounter: Secondary | ICD-10-CM | POA: Diagnosis not present

## 2017-04-28 DIAGNOSIS — Y998 Other external cause status: Secondary | ICD-10-CM | POA: Diagnosis not present

## 2017-04-28 MED ORDER — BUTALBITAL-APAP-CAFFEINE 50-325-40 MG PO TABS: 1.0000 | ORAL_TABLET | Freq: Four times a day (QID) | ORAL | 0 refills | Status: DC | PRN

## 2017-04-28 MED ORDER — PROCHLORPERAZINE MALEATE 5 MG PO TABS
10.0000 mg | ORAL_TABLET | Freq: Once | ORAL | Status: AC
  Administered 2017-04-28: 10 mg via ORAL
  Filled 2017-04-28: qty 2

## 2017-04-28 MED ORDER — KETOROLAC TROMETHAMINE 10 MG PO TABS
10.0000 mg | ORAL_TABLET | Freq: Once | ORAL | Status: DC
  Filled 2017-04-28: qty 1

## 2017-04-28 MED ORDER — NAPROXEN 500 MG PO TABS: 500.0000 mg | ORAL_TABLET | Freq: Two times a day (BID) | ORAL | 0 refills | Status: DC

## 2017-04-28 MED ORDER — ACETAMINOPHEN 500 MG PO TABS
1000.0000 mg | ORAL_TABLET | Freq: Once | ORAL | Status: AC
  Administered 2017-04-28: 1000 mg via ORAL
  Filled 2017-04-28: qty 2

## 2017-04-28 NOTE — Discharge Instructions (Signed)
Often individuals will develop a headache associated with mild nausea in the days or hours after a head injury. This is called a concussion and may require further follow up.  Please seek prompt medical care if: You have: A very bad (severe) headache that is not helped by medicine. Trouble walking or weakness in your arms and legs. Clear or bloody fluid coming from your nose or ears. Changes in your seeing (vision). Jerky movements that you cannot control (seizure). You throw up (vomit). Your symptoms get worse. You lose balance. Your speech is slurred. You pass out. You are sleepier and have trouble staying awake. The black centers of your eyes (pupils) change in size.  These symptoms may be an emergency. Do not wait to see if the symptoms will go away. Get medical help right away. Call your local emergency services. Do not drive yourself to the hospital.
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
## 2017-04-28 NOTE — Discharge Instructions (Signed)
CT scan of head is normal.   I suspect your headache is from a concussion. Take tylenol 1000 mg every 8 hours for pain. If pain is more severe, take fioricet. Follow up with concussion clinic in 1 week if headache does not improve.   Your CT scan is negative.  Your symptoms are most likely due to a concussion. Read attached information on concussion.   Concussion symptoms include include headache, nausea, sleep disturbance, dizziness, short term memory loss. Usually these symptom improve slowly over a period of 2-3 weeks, usually sooner. These symptoms wax and wane, and can be worsened by physical or cognitive exertion like reading, watching Tv, studying, using computer, playing video games.   We recommend physical and cognitive rest for the next 48-72 hours. This means avoid any exertional or cognitive activity that makes your symptoms worse including reading, playing video games, watching TV, exercising, jumping, playing sports.  The most important part of concussion management is avoiding a repeat head injury, do not perform any activities that will put you at risk for a second head injury. You must be cleared by a clinician before fully returning to sports. If your symptoms last longer than 2-3 week you need to further evaluation by primary care provider or concussion specialist.   Return to the emergency department if you develop mental status change, lethargy, vomiting, vision changes

## 2017-04-28 NOTE — ED Notes (Signed)
Lab work, radiology results and vital signs reviewed, no critical results at this time, no change in acuity indicated.  
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
## 2017-05-28 ENCOUNTER — Inpatient Hospital Stay (HOSPITAL_COMMUNITY)
Admission: EM | Admit: 2017-05-28 | Discharge: 2017-06-01 | DRG: 603 | Disposition: A | Discharge status: Home / Self Care | Payer: BLUE CROSS/BLUE SHIELD | Attending: Internal Medicine | Admitting: Internal Medicine

## 2017-05-28 ENCOUNTER — Other Ambulatory Visit: Payer: Self-pay

## 2017-05-28 ENCOUNTER — Emergency Department (HOSPITAL_COMMUNITY): Payer: BLUE CROSS/BLUE SHIELD

## 2017-05-28 ENCOUNTER — Encounter (HOSPITAL_COMMUNITY): Payer: Self-pay | Admitting: *Deleted

## 2017-05-28 DIAGNOSIS — R1900 Intra-abdominal and pelvic swelling, mass and lump, unspecified site: Secondary | ICD-10-CM | POA: Diagnosis present

## 2017-05-28 DIAGNOSIS — Z79899 Other long term (current) drug therapy: Secondary | ICD-10-CM

## 2017-05-28 DIAGNOSIS — Z79891 Long term (current) use of opiate analgesic: Secondary | ICD-10-CM

## 2017-05-28 DIAGNOSIS — F319 Bipolar disorder, unspecified: Secondary | ICD-10-CM | POA: Diagnosis present

## 2017-05-28 DIAGNOSIS — L03211 Cellulitis of face: Secondary | ICD-10-CM

## 2017-05-28 DIAGNOSIS — K130 Diseases of lips: Secondary | ICD-10-CM | POA: Diagnosis present

## 2017-05-28 DIAGNOSIS — L0390 Cellulitis, unspecified: Secondary | ICD-10-CM | POA: Diagnosis present

## 2017-05-28 DIAGNOSIS — L02211 Cutaneous abscess of abdominal wall: Secondary | ICD-10-CM

## 2017-05-28 DIAGNOSIS — F3170 Bipolar disorder, currently in remission, most recent episode unspecified: Secondary | ICD-10-CM

## 2017-05-28 LAB — CBC WITH DIFFERENTIAL/PLATELET
BASOS ABS: 0 10*3/uL (ref 0.0–0.1)
Basophils Relative: 0 %
EOS ABS: 0 10*3/uL (ref 0.0–0.7)
Eosinophils Relative: 0 %
HCT: 46 % (ref 39.0–52.0)
HEMOGLOBIN: 15.9 g/dL (ref 13.0–17.0)
LYMPHS ABS: 2.1 10*3/uL (ref 0.7–4.0)
LYMPHS PCT: 17 %
MCH: 31.8 pg (ref 26.0–34.0)
MCHC: 34.6 g/dL (ref 30.0–36.0)
MCV: 92 fL (ref 78.0–100.0)
Monocytes Absolute: 1.7 10*3/uL — ABNORMAL HIGH (ref 0.1–1.0)
Monocytes Relative: 14 %
NEUTROS PCT: 69 %
Neutro Abs: 8.6 10*3/uL — ABNORMAL HIGH (ref 1.7–7.7)
PLATELETS: 238 10*3/uL (ref 150–400)
RBC: 5 MIL/uL (ref 4.22–5.81)
RDW: 12.8 % (ref 11.5–15.5)
WBC: 12.5 10*3/uL — AB (ref 4.0–10.5)

## 2017-05-28 LAB — BASIC METABOLIC PANEL
ANION GAP: 11 (ref 5–15)
BUN: 9 mg/dL (ref 6–20)
CHLORIDE: 99 mmol/L — AB (ref 101–111)
CO2: 25 mmol/L (ref 22–32)
Calcium: 9.4 mg/dL (ref 8.9–10.3)
Creatinine, Ser: 1.28 mg/dL — ABNORMAL HIGH (ref 0.61–1.24)
GFR calc Af Amer: >60 mL/min (ref >60)
Glucose, Bld: 95 mg/dL (ref 65–99)
POTASSIUM: 4.1 mmol/L (ref 3.5–5.1)
SODIUM: 135 mmol/L (ref 135–145)

## 2017-05-28 MED ORDER — SODIUM CHLORIDE 0.9 % IV SOLN: 250.0000 mL | INTRAVENOUS | Status: DC | PRN

## 2017-05-28 MED ORDER — IOPAMIDOL (ISOVUE-300) INJECTION 61%
INTRAVENOUS | Status: AC
  Administered 2017-05-28: 75 mL
  Filled 2017-05-28: qty 75

## 2017-05-28 MED ORDER — SODIUM CHLORIDE 0.9% FLUSH
3.0000 mL | Freq: Two times a day (BID) | INTRAVENOUS | Status: DC
  Administered 2017-05-28 – 2017-06-01 (×6): 3 mL via INTRAVENOUS

## 2017-05-28 MED ORDER — CLINDAMYCIN PHOSPHATE 600 MG/50ML IV SOLN
600.0000 mg | Freq: Once | INTRAVENOUS | Status: AC
  Administered 2017-05-28: 600 mg via INTRAVENOUS
  Filled 2017-05-28: qty 50

## 2017-05-28 MED ORDER — DIPHENHYDRAMINE HCL 50 MG/ML IJ SOLN: 25.0000 mg | Freq: Once | INTRAMUSCULAR | Status: DC

## 2017-05-28 MED ORDER — SODIUM CHLORIDE 0.9 % IJ SOLN
INTRAMUSCULAR | Status: AC
  Filled 2017-05-28: qty 50

## 2017-05-28 MED ORDER — SODIUM CHLORIDE 0.9% FLUSH: 3.0000 mL | INTRAVENOUS | Status: DC | PRN

## 2017-05-28 MED ORDER — ACETAMINOPHEN 650 MG RE SUPP: 650.0000 mg | Freq: Four times a day (QID) | RECTAL | Status: DC | PRN

## 2017-05-28 MED ORDER — TRAMADOL HCL 50 MG PO TABS
100.0000 mg | ORAL_TABLET | Freq: Four times a day (QID) | ORAL | Status: DC | PRN
  Administered 2017-05-28 – 2017-05-29 (×4): 100 mg via ORAL
  Filled 2017-05-28 (×4): qty 2

## 2017-05-28 MED ORDER — CLINDAMYCIN PHOSPHATE 600 MG/50ML IV SOLN
600.0000 mg | Freq: Three times a day (TID) | INTRAVENOUS | Status: DC
  Administered 2017-05-28 – 2017-06-01 (×11): 600 mg via INTRAVENOUS
  Filled 2017-05-28 (×14): qty 50

## 2017-05-28 MED ORDER — ACETAMINOPHEN 325 MG PO TABS
650.0000 mg | ORAL_TABLET | Freq: Four times a day (QID) | ORAL | Status: DC | PRN
  Administered 2017-05-28 – 2017-05-31 (×5): 650 mg via ORAL
  Filled 2017-05-28 (×7): qty 2

## 2017-05-28 MED ORDER — FENTANYL CITRATE (PF) 100 MCG/2ML IJ SOLN
50.0000 ug | Freq: Once | INTRAMUSCULAR | Status: AC
  Administered 2017-05-28: 50 ug via INTRAVENOUS
  Filled 2017-05-28: qty 2

## 2017-05-28 NOTE — ED Notes (Signed)
Delay in giving report, bed has been ready almost an hour, Diplomatic Services operational officersecretary unaware of "purple avatar" process. On hold currently to give report.
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
## 2017-05-28 NOTE — ED Notes (Signed)
Bed: [id number] Expected date:  Expected time:  Means of arrival:  Comments: 22 yo Angioedema
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
## 2017-05-28 NOTE — ED Triage Notes (Addendum)
EMS [facility] in from [facility] due to angioedema that started on Wednesday and has become worse. Pt had benadryl last night. Mainly upper lip and rt jaw. No airway compromise. 20 IV Lac and 50 Benadryl given by EMS

## 2017-05-28 NOTE — ED Notes (Signed)
Pt currently in CT.

## 2017-05-29 DIAGNOSIS — F319 Bipolar disorder, unspecified: Secondary | ICD-10-CM | POA: Diagnosis present

## 2017-05-29 DIAGNOSIS — Z79899 Other long term (current) drug therapy: Secondary | ICD-10-CM | POA: Diagnosis not present

## 2017-05-29 DIAGNOSIS — K130 Diseases of lips: Secondary | ICD-10-CM | POA: Diagnosis present

## 2017-05-29 DIAGNOSIS — L02211 Cutaneous abscess of abdominal wall: Secondary | ICD-10-CM | POA: Diagnosis not present

## 2017-05-29 DIAGNOSIS — Z79891 Long term (current) use of opiate analgesic: Secondary | ICD-10-CM | POA: Diagnosis not present

## 2017-05-29 DIAGNOSIS — R1900 Intra-abdominal and pelvic swelling, mass and lump, unspecified site: Secondary | ICD-10-CM | POA: Diagnosis present

## 2017-05-29 DIAGNOSIS — F3170 Bipolar disorder, currently in remission, most recent episode unspecified: Secondary | ICD-10-CM | POA: Diagnosis not present

## 2017-05-29 DIAGNOSIS — L03211 Cellulitis of face: Secondary | ICD-10-CM | POA: Diagnosis present

## 2017-05-29 LAB — BASIC METABOLIC PANEL
ANION GAP: 11 (ref 5–15)
BUN: 10 mg/dL (ref 6–20)
CALCIUM: 9.3 mg/dL (ref 8.9–10.3)
CO2: 25 mmol/L (ref 22–32)
CREATININE: 1.13 mg/dL (ref 0.61–1.24)
Chloride: 99 mmol/L — ABNORMAL LOW (ref 101–111)
Glucose, Bld: 123 mg/dL — ABNORMAL HIGH (ref 65–99)
Potassium: 4.1 mmol/L (ref 3.5–5.1)
SODIUM: 135 mmol/L (ref 135–145)

## 2017-05-29 LAB — CBC
HEMATOCRIT: 44.9 % (ref 39.0–52.0)
Hemoglobin: 15.5 g/dL (ref 13.0–17.0)
MCH: 31.4 pg (ref 26.0–34.0)
MCHC: 34.5 g/dL (ref 30.0–36.0)
MCV: 91.1 fL (ref 78.0–100.0)
Platelets: 208 10*3/uL (ref 150–400)
RBC: 4.93 MIL/uL (ref 4.22–5.81)
RDW: 12.8 % (ref 11.5–15.5)
WBC: 12.4 10*3/uL — AB (ref 4.0–10.5)

## 2017-05-29 MED ORDER — HYDROMORPHONE HCL 1 MG/ML IJ SOLN
1.0000 mg | INTRAMUSCULAR | Status: DC | PRN
  Administered 2017-05-29 – 2017-05-30 (×6): 1 mg via INTRAVENOUS
  Filled 2017-05-29 (×7): qty 1

## 2017-05-29 MED ORDER — ENOXAPARIN SODIUM 40 MG/0.4ML ~~LOC~~ SOLN
40.0000 mg | SUBCUTANEOUS | Status: DC
  Administered 2017-05-29 – 2017-05-30 (×2): 40 mg via SUBCUTANEOUS
  Filled 2017-05-29 (×3): qty 0.4
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
## 2017-05-30 LAB — BASIC METABOLIC PANEL
Anion gap: 12 (ref 5–15)
BUN: 13 mg/dL (ref 6–20)
CALCIUM: 9.8 mg/dL (ref 8.9–10.3)
CO2: 25 mmol/L (ref 22–32)
CREATININE: 1.09 mg/dL (ref 0.61–1.24)
Chloride: 98 mmol/L — ABNORMAL LOW (ref 101–111)
GFR calc Af Amer: >60 mL/min (ref >60)
Glucose, Bld: 101 mg/dL — ABNORMAL HIGH (ref 65–99)
Potassium: 4.3 mmol/L (ref 3.5–5.1)
Sodium: 135 mmol/L (ref 135–145)

## 2017-05-30 LAB — CBC
HCT: 46.6 % (ref 39.0–52.0)
Hemoglobin: 16.4 g/dL (ref 13.0–17.0)
MCH: 31.5 pg (ref 26.0–34.0)
MCHC: 35.2 g/dL (ref 30.0–36.0)
MCV: 89.6 fL (ref 78.0–100.0)
PLATELETS: 223 10*3/uL (ref 150–400)
RBC: 5.2 MIL/uL (ref 4.22–5.81)
RDW: 12.5 % (ref 11.5–15.5)
WBC: 12.3 10*3/uL — AB (ref 4.0–10.5)

## 2017-05-30 MED ORDER — HYDROMORPHONE HCL 1 MG/ML IJ SOLN
1.0000 mg | INTRAMUSCULAR | Status: DC | PRN
  Administered 2017-05-30 – 2017-06-01 (×13): 1 mg via INTRAVENOUS
  Filled 2017-05-30 (×13): qty 1
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
## 2017-05-30 NOTE — Progress Notes (Addendum)
Call made to Dr Sharl MaLama to request change in PRN medication, as IV Dilaudid every four hours is not holding patient. Requested something by mouth that may last longer. Lina SarBeth Lillyanna Glandon, RN
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
## 2017-05-31 ENCOUNTER — Encounter (HOSPITAL_COMMUNITY): Payer: Self-pay | Admitting: Radiology

## 2017-05-31 ENCOUNTER — Inpatient Hospital Stay (HOSPITAL_COMMUNITY): Payer: BLUE CROSS/BLUE SHIELD

## 2017-05-31 LAB — CBC
HCT: 46.3 % (ref 39.0–52.0)
Hemoglobin: 15.9 g/dL (ref 13.0–17.0)
MCH: 31.2 pg (ref 26.0–34.0)
MCHC: 34.3 g/dL (ref 30.0–36.0)
MCV: 90.8 fL (ref 78.0–100.0)
PLATELETS: 253 10*3/uL (ref 150–400)
RBC: 5.1 MIL/uL (ref 4.22–5.81)
RDW: 12.5 % (ref 11.5–15.5)
WBC: 7.3 10*3/uL (ref 4.0–10.5)

## 2017-05-31 MED ORDER — ALPRAZOLAM 0.5 MG PO TABS
0.5000 mg | ORAL_TABLET | Freq: Three times a day (TID) | ORAL | Status: DC | PRN
  Administered 2017-05-31 – 2017-06-01 (×4): 0.5 mg via ORAL
  Filled 2017-05-31 (×4): qty 1

## 2017-05-31 MED ORDER — IOPAMIDOL (ISOVUE-300) INJECTION 61%
INTRAVENOUS | Status: AC
  Administered 2017-05-31: 100 mL via INTRAVENOUS
  Filled 2017-05-31: qty 100

## 2017-05-31 MED ORDER — IOPAMIDOL (ISOVUE-300) INJECTION 61%
INTRAVENOUS | Status: AC
  Administered 2017-05-31: 30 mL via ORAL
  Filled 2017-05-31: qty 30
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
## 2017-06-01 DIAGNOSIS — F3170 Bipolar disorder, currently in remission, most recent episode unspecified: Secondary | ICD-10-CM

## 2017-06-01 DIAGNOSIS — L03211 Cellulitis of face: Principal | ICD-10-CM

## 2017-06-01 DIAGNOSIS — L02211 Cutaneous abscess of abdominal wall: Secondary | ICD-10-CM

## 2017-06-01 LAB — AEROBIC CULTURE W GRAM STAIN (SUPERFICIAL SPECIMEN)

## 2017-06-01 LAB — MRSA PCR SCREENING: MRSA BY PCR: POSITIVE — AB

## 2017-06-01 MED ORDER — ACETAMINOPHEN 325 MG PO TABS: 650.0000 mg | ORAL_TABLET | Freq: Four times a day (QID) | ORAL | 0 refills | Status: DC | PRN

## 2017-06-01 MED ORDER — SULFAMETHOXAZOLE-TRIMETHOPRIM 400-80 MG PO TABS: 1.0000 | ORAL_TABLET | Freq: Two times a day (BID) | ORAL | 0 refills | Status: AC

## 2017-06-01 MED ORDER — IBUPROFEN 200 MG PO TABS: 200.0000 mg | ORAL_TABLET | Freq: Four times a day (QID) | ORAL | 0 refills | Status: DC | PRN

## 2017-06-01 MED ORDER — SULFAMETHOXAZOLE-TRIMETHOPRIM 400-80 MG PO TABS
1.0000 | ORAL_TABLET | Freq: Two times a day (BID) | ORAL | Status: DC
  Administered 2017-06-01: 1 via ORAL
  Filled 2017-06-01: qty 1

## 2017-06-01 NOTE — Discharge Summary (Signed)
Physician Discharge Summary  Kurt Thornton WUJ:[id number] DOB: 18-Jan-1996 DOA: 05/28/2017  PCP: Patient, No Pcp Per  Admit date: 05/28/2017 Discharge date: 06/01/2017  Admitted From:  Home Disposition:  Home  Recommendations for Outpatient Follow-up and new medication changes:  1. Follow up with PCP in 1- week 2. Patient has been placed on Bactrim to complete antibiotic therapy.  3. Follow up with Dr. Zoila Shutter (ENT) in one week for wound check 4. Pain control with acetaminophen and ibuprofen.    Home Health: No Equipment/Devices: No    Discharge Condition: stable CODE STATUS: full  Diet recommendation: Regular  Brief/Interim Summary: 22 year old male who presented with facial swelling.  Patient does have the significant past medical history for bipolar disorder.  For the last 3 days prior to hospitalization he noticed worsening lip edema and redness, progressing into his upper lip, and and extending erythema up to his right eye.  On the initial physical examination his blood pressure 147/91, heart rate 57, respiratory 14, temperature 98.4, oxygen saturation 100%.  Moist mucous membranes, lungs clear to auscultation bilaterally, heart S1-S2 present rhythmic, abdomen soft nontender, no lower extremity edema.  He had a right upper lip edema, along with induration, tender to palpation, and erythematous rash extending below his right eye.  Sodium 135, potassium 4.1, chloride 99, bicarb 25, glucose 95, BUN 9, creatinine 1.28, white count 12.5, hemoglobin 15.9, hematocrit 46.0, platelets 238.  Maxillofacial CT with soft tissue swelling of the face with the epicenter in the upper lip region.   Patient was admitted to the hospital with a working diagnosis of facial cellulitis.  1.  Left facial cellulitis.  Patient was admitted to the medical ward, he received IV fluids,  IV antibiotic therapy with clindamycin, plus analgesics and antipyretics.  Patient was seen by ENT, that concluded patient  suffered from a subcentimeter lip abscess spontaneously drained, it was probed at the bedside, with no indication for I&D.  Culture returned positive for methicillin-resistant Staphylococcus aureus and Enterobacter aerogenes.  Patient was transitioned to oral Bactrim, to continue for 7 more days.   2.  Abdominal distention, abdominal Logiudice swelling ruled out for cellulitis.  Further workup with ultrasonography was negative, CT of the abdomen with minimal stranding within the subcutaneous soft tissues of the right of the umbilicus. Clinically with no erythema, no increased local temperature, doubt this is a skin infection.    3.  Bipolar.  No agitation or confusion.  Discharge Diagnoses:  Principal Problem:   Cellulitis of face Active Problems:   Bipolar disorder (HCC)   Imprisonment and other incarceration    Discharge Instructions   Allergies as of 06/01/2017   No Known Allergies     Medication List    STOP taking these medications   butalbital-acetaminophen-caffeine 50-325-40 MG tablet Commonly known as:  FIORICET, ESGIC   cephALEXin 500 MG capsule Commonly known as:  KEFLEX   meloxicam 15 MG tablet Commonly known as:  MOBIC   naproxen 500 MG tablet Commonly known as:  NAPROSYN   oxyCODONE 5 MG immediate release tablet Commonly known as:  Oxy IR/ROXICODONE     TAKE these medications   acetaminophen 325 MG tablet Commonly known as:  TYLENOL Take 2 tablets (650 mg total) by mouth every 6 (six) hours as needed for mild pain (or Fever >/= 101).   ibuprofen 200 MG tablet Commonly known as:  ADVIL,MOTRIN Take 1 tablet (200 mg total) by mouth every 6 (six) hours as needed (facial pain.).   sulfamethoxazole-trimethoprim  400-80 MG tablet Commonly known as:  BACTRIM,SEPTRA Take 1 tablet by mouth every 12 (twelve) hours for 7 days.       No Known Allergies  Consultations:  ENT   Procedures/Studies: Ct Abdomen Pelvis W Contrast  Result Date: 05/31/2017 CLINICAL  DATA:  Abdominal Kryder swelling around the umbilicus. Tender to palpation. EXAM: CT ABDOMEN AND PELVIS WITH CONTRAST TECHNIQUE: Multidetector CT imaging of the abdomen and pelvis was performed using the standard protocol following bolus administration of intravenous contrast. CONTRAST:  ISOVUE-300 IOPAMIDOL (ISOVUE-300) INJECTION 61%, COMPARISON:  Ultrasound 05/31/2017. FINDINGS: Lower chest: Lung bases are clear. No effusions. Heart is normal size. Hepatobiliary: No focal hepatic abnormality. Gallbladder unremarkable. Pancreas: No focal abnormality or ductal dilatation. Spleen: No focal abnormality.  Normal size. Adrenals/Urinary Tract: No adrenal abnormality. No focal renal abnormality. No stones or hydronephrosis. Urinary bladder is unremarkable. Stomach/Bowel: Large stool burden throughout the colon. No evidence of bowel obstruction. Stomach, large and small bowel grossly unremarkable. Vascular/Lymphatic: No evidence of aneurysm or adenopathy. Reproductive: No visible focal abnormality. Other: No free fluid or free air. Minimal/slight stranding within the subcutaneous soft tissues to the right of the umbilicus, likely in the area concern. No focal measurable fluid collection. Musculoskeletal: No acute bony abnormality. IMPRESSION: Minimal stranding within the subcutaneous soft tissues to the right of the umbilicus could reflect small area of infection/cellulitis. No focal fluid collection. Large stool burden in the colon. Electronically Signed   By: Charlett Nose M.D.   On: 05/31/2017 20:18   US Abdomen Limited  Result Date: 05/31/2017 CLINICAL DATA:  Palpable abdominal Ahles lump to the right of the umbilicus. EXAM: ULTRASOUND ABDOMEN LIMITED COMPARISON:  No prior. FINDINGS: No cystic or solid abnormalities identified. No hernia identified. Further evaluation is needed CT can be obtained. IMPRESSION: No significant abnormality identified. Specifically no evidence of hernia or mass lesion noted.  Electronically Signed   By: Maisie Fus  Register   On: 05/31/2017 10:36   Ct Maxillofacial W Contrast  Result Date: 05/28/2017 CLINICAL DATA:  Angioedema of the face which is progressively worsening. EXAM: CT MAXILLOFACIAL WITH CONTRAST TECHNIQUE: Multidetector CT imaging of the maxillofacial structures was performed with intravenous contrast. Multiplanar CT image reconstructions were also generated. CONTRAST:  75mL ISOVUE-300 IOPAMIDOL (ISOVUE-300) INJECTION 61% COMPARISON:  None. FINDINGS: Osseous: No abnormal bone finding. No dental or periodontal disease. Orbits: Normal Sinuses: Normal Soft tissues: Nonspecific diffuse soft tissue swelling of the face, with the epicenter in the upper lip region. There are some areas of slightly diminished enhancement in that region and it is possible that there could be early abscess formation of the upper lip, just beneath the nose. Limited intracranial: Normal IMPRESSION: Soft tissue swelling of the face, with the epicenter in the upper lip region. Question early region of nonenhancing tissue at the upper lip just below the nose, raising the possibility of early abscess formation in this location. This may be phlegmonous at this time however. No evidence of deep space infection. No evidence of dental or periodontal disease or sinus disease. Electronically Signed   By: Paulina Fusi M.D.   On: 05/28/2017 12:56       Subjective: Patient is feeling better, no facial swelling has improved, no dysphagia or odynophagia.  Denies any dyspnea  Discharge Exam: Vitals:   06/01/17 0635 06/01/17 1338  BP: (!) 145/88 139/81  Pulse: 100 98  Resp: 18 20  Temp: 98 F (36.7 C) 99 F (37.2 C)  SpO2: 100% 98%   Vitals:  05/31/17 1222 05/31/17 2131 06/01/17 0635 06/01/17 1338  BP: (!) 142/87 129/69 (!) 145/88 139/81  Pulse: 90 (!) 102 100 98  Resp: 18 17 18 20   Temp: 98.4 F (36.9 C) 98.7 F (37.1 C) 98 F (36.7 C) 99 F (37.2 C)  TempSrc: Oral Oral Oral Oral  SpO2:  97% 99% 100% 98%  Weight:      Height:        General: Not in pain or dyspnea  Neurology: Awake and alert, non focal  E ENT: no pallor, no icterus, oral mucosa moist Cardiovascular: No JVD. S1-S2 present, rhythmic, no gallops, rubs, or murmurs. No lower extremity edema. Pulmonary: vesicular breath sounds bilaterally, adequate air movement, no wheezing, rhonchi or rales. Gastrointestinal. Abdomen flat, no organomegaly, non tender, no rebound or guarding Skin.  Mild facial swelling on the right, there is no erythema.  Musculoskeletal: no joint deformities   The results of significant diagnostics from this hospitalization (including imaging, microbiology, ancillary and laboratory) are listed below for reference.     Microbiology: Recent Results (from the past 240 hour(s))  Aerobic Culture (superficial specimen)     Status: None   Collection Time: 05/30/17  9:29 AM  Result Value Ref Range Status   Specimen Description   Final    WOUND FACE Performed at [facility], [address] [place], [place] [address]    Special Requests   Final    NONE Performed at [facility], [address] [place], [place] [address]    Gram Stain   Final    MODERATE WBC PRESENT, PREDOMINANTLY PMN MODERATE GRAM POSITIVE COCCI IN CLUSTERS Performed at [facility], [address], [place], [place] [address]    Culture   Final    MODERATE METHICILLIN RESISTANT STAPHYLOCOCCUS AUREUS RARE ENTEROBACTER AEROGENES    Report Status 06/01/2017 FINAL  Final   Organism ID, Bacteria METHICILLIN RESISTANT STAPHYLOCOCCUS AUREUS  Final   Organism ID, Bacteria ENTEROBACTER AEROGENES  Final      Susceptibility   Enterobacter aerogenes - MIC*    CEFAZOLIN >=64 RESISTANT Resistant     CEFEPIME <=1 SENSITIVE Sensitive     CEFTAZIDIME <=1 SENSITIVE Sensitive     CEFTRIAXONE <=1 SENSITIVE Sensitive     CIPROFLOXACIN <=0.25 SENSITIVE Sensitive     GENTAMICIN <=1  SENSITIVE Sensitive     IMIPENEM 1 SENSITIVE Sensitive     TRIMETH/SULFA <=20 SENSITIVE Sensitive     PIP/TAZO 8 SENSITIVE Sensitive     * RARE ENTEROBACTER AEROGENES   Methicillin resistant staphylococcus aureus - MIC*    CIPROFLOXACIN <=0.5 SENSITIVE Sensitive     ERYTHROMYCIN >=8 RESISTANT Resistant     GENTAMICIN <=0.5 SENSITIVE Sensitive     OXACILLIN >=4 RESISTANT Resistant     TETRACYCLINE <=1 SENSITIVE Sensitive     VANCOMYCIN 1 SENSITIVE Sensitive     TRIMETH/SULFA <=10 SENSITIVE Sensitive     CLINDAMYCIN <=0.25 SENSITIVE Sensitive     RIFAMPIN <=0.5 SENSITIVE Sensitive     Inducible Clindamycin NEGATIVE Sensitive     * MODERATE METHICILLIN RESISTANT STAPHYLOCOCCUS AUREUS     Labs: BNP (last 3 results) No results for input(s): BNP in the last 8760 hours. Basic Metabolic Panel: Recent Labs  Lab 05/28/17 1115 05/29/17 0826 05/30/17 0426  NA 135 135 135  K 4.1 4.1 4.3  CL 99* 99* 98*  CO2 25 25 25   GLUCOSE 95 123* 101*  BUN 9 10 13   CREATININE 1.28* 1.13 1.09  CALCIUM 9.4 9.3 9.8   Liver Function Tests: No results for input(s): AST, ALT, ALKPHOS, BILITOT, PROT, ALBUMIN in the last 168 hours. No results for input(s): LIPASE, AMYLASE in the last 168 hours. No results for input(s): AMMONIA in the last 168 hours. CBC: Recent Labs  Lab 05/28/17 1115 05/29/17 0826 05/30/17 0426 05/31/17 0434  WBC 12.5* 12.4* 12.3* 7.3  NEUTROABS 8.6*  --   --   --   HGB 15.9 15.5 16.4 15.9  HCT 46.0 44.9 46.6 46.3  MCV 92.0 91.1 89.6 90.8  PLT 238 208 223 253   Cardiac Enzymes: No results for input(s): CKTOTAL, CKMB, CKMBINDEX, TROPONINI in the last 168 hours. BNP: Invalid input(s): POCBNP CBG: No results for input(s): GLUCAP in the last 168 hours. D-Dimer No results for input(s): DDIMER in the last 72 hours. Hgb A1c No results for input(s): HGBA1C in the last 72 hours. Lipid Profile No results for input(s): CHOL, HDL, LDLCALC, TRIG, CHOLHDL, LDLDIRECT in the last 72  hours. Thyroid function studies No results for input(s): TSH, T4TOTAL, T3FREE, THYROIDAB in the last 72 hours.  Invalid input(s): FREET3 Anemia work up No results for input(s): VITAMINB12, FOLATE, FERRITIN, TIBC, IRON, RETICCTPCT in the last 72 hours. Urinalysis    Component Value Date/Time   COLORURINE YELLOW 06/30/2015 1630   APPEARANCEUR HAZY (A) 06/30/2015 1630   LABSPEC 1.024 06/30/2015 1630   PHURINE 7.0 06/30/2015 1630   GLUCOSEU NEGATIVE 06/30/2015 1630   HGBUR NEGATIVE 06/30/2015 1630   BILIRUBINUR negative 12/11/2015 1435   KETONESUR NEGATIVE 06/30/2015 1630   PROTEINUR negative 12/11/2015 1435   PROTEINUR NEGATIVE 06/30/2015 1630   UROBILINOGEN 0.2 12/11/2015 1435   NITRITE negative 12/11/2015 1435   NITRITE NEGATIVE 06/30/2015 1630   LEUKOCYTESUR small (1+) (A) 12/11/2015 1435   Sepsis Labs Invalid input(s): PROCALCITONIN,  WBC,  LACTICIDVEN Microbiology Recent Results (from the past 240 hour(s))  Aerobic Culture (superficial specimen)     Status: None   Collection Time: 05/30/17  9:29 AM  Result Value Ref Range Status   Specimen Description   Final    WOUND FACE Performed at [facility], [address] [place], [place] [address]    Special Requests   Final    NONE Performed at [facility], [address] [place], [place] [address]    Gram Stain   Final    MODERATE WBC PRESENT, PREDOMINANTLY PMN MODERATE GRAM POSITIVE COCCI IN CLUSTERS Performed at [facility], [address] [place], [place] [address]    Culture   Final    MODERATE METHICILLIN RESISTANT STAPHYLOCOCCUS AUREUS RARE ENTEROBACTER AEROGENES    Report Status 06/01/2017 FINAL  Final   Organism ID, Bacteria METHICILLIN RESISTANT STAPHYLOCOCCUS AUREUS  Final   Organism ID, Bacteria ENTEROBACTER AEROGENES  Final      Susceptibility   Enterobacter aerogenes - MIC*    CEFAZOLIN >=64 RESISTANT Resistant     CEFEPIME <=1 SENSITIVE Sensitive      CEFTAZIDIME <=1 SENSITIVE Sensitive     CEFTRIAXONE <=1 SENSITIVE Sensitive     CIPROFLOXACIN <=0.25 SENSITIVE Sensitive     GENTAMICIN <=1 SENSITIVE Sensitive     IMIPENEM 1 SENSITIVE Sensitive     TRIMETH/SULFA <=20 SENSITIVE Sensitive     PIP/TAZO 8 SENSITIVE Sensitive     * RARE ENTEROBACTER AEROGENES   Methicillin resistant staphylococcus aureus - MIC*    CIPROFLOXACIN <=0.5 SENSITIVE Sensitive     ERYTHROMYCIN >=8 RESISTANT Resistant  GENTAMICIN <=0.5 SENSITIVE Sensitive     OXACILLIN >=4 RESISTANT Resistant     TETRACYCLINE <=1 SENSITIVE Sensitive     VANCOMYCIN 1 SENSITIVE Sensitive     TRIMETH/SULFA <=10 SENSITIVE Sensitive     CLINDAMYCIN <=0.25 SENSITIVE Sensitive     RIFAMPIN <=0.5 SENSITIVE Sensitive     Inducible Clindamycin NEGATIVE Sensitive     * MODERATE METHICILLIN RESISTANT STAPHYLOCOCCUS AUREUS     Time coordinating discharge: 45 minutes  SIGNED:   Coralie Keens, MD  [facility] 06/01/2017, 1:40 PM Pager [phone]  If 7PM-7AM, please contact night-coverage [email or web address] Password [email or web address]

## 2017-06-02 NOTE — Progress Notes (Signed)
Notified Kirby NP about positive result MRSA PCR.

## 2017-09-28 ENCOUNTER — Other Ambulatory Visit: Payer: Self-pay

## 2017-09-28 ENCOUNTER — Emergency Department (HOSPITAL_COMMUNITY)
Admission: EM | Admit: 2017-09-28 | Discharge: 2017-09-28 | Discharge status: Against Medical Advice | Payer: BLUE CROSS/BLUE SHIELD

## 2017-09-28 NOTE — ED Notes (Signed)
Called x3 in the lobby for triage

## 2017-09-28 NOTE — ED Notes (Signed)
Pt called x3, unable to locate. 

## 2017-10-21 ENCOUNTER — Emergency Department (HOSPITAL_COMMUNITY): Payer: BLUE CROSS/BLUE SHIELD

## 2017-10-21 ENCOUNTER — Other Ambulatory Visit: Payer: Self-pay

## 2017-10-21 ENCOUNTER — Emergency Department (HOSPITAL_COMMUNITY)
Admission: EM | Admit: 2017-10-21 | Discharge: 2017-10-21 | Disposition: A | Discharge status: Home / Self Care | Payer: BLUE CROSS/BLUE SHIELD | Attending: Emergency Medicine | Admitting: Emergency Medicine

## 2017-10-21 ENCOUNTER — Encounter (HOSPITAL_COMMUNITY): Payer: Self-pay | Admitting: Emergency Medicine

## 2017-10-21 DIAGNOSIS — M542 Cervicalgia: Secondary | ICD-10-CM | POA: Insufficient documentation

## 2017-10-21 DIAGNOSIS — Y999 Unspecified external cause status: Secondary | ICD-10-CM | POA: Diagnosis not present

## 2017-10-21 DIAGNOSIS — M546 Pain in thoracic spine: Secondary | ICD-10-CM | POA: Diagnosis not present

## 2017-10-21 DIAGNOSIS — M79601 Pain in right arm: Secondary | ICD-10-CM | POA: Diagnosis not present

## 2017-10-21 DIAGNOSIS — R51 Headache: Secondary | ICD-10-CM | POA: Diagnosis present

## 2017-10-21 DIAGNOSIS — M79642 Pain in left hand: Secondary | ICD-10-CM | POA: Diagnosis not present

## 2017-10-21 DIAGNOSIS — Y9389 Activity, other specified: Secondary | ICD-10-CM | POA: Insufficient documentation

## 2017-10-21 DIAGNOSIS — Y929 Unspecified place or not applicable: Secondary | ICD-10-CM | POA: Insufficient documentation

## 2017-10-21 DIAGNOSIS — T07XXXA Unspecified multiple injuries, initial encounter: Secondary | ICD-10-CM | POA: Diagnosis not present

## 2017-10-21 DIAGNOSIS — M545 Low back pain: Secondary | ICD-10-CM | POA: Insufficient documentation

## 2017-10-21 LAB — CBC WITH DIFFERENTIAL/PLATELET
Abs Immature Granulocytes: 0 10*3/uL (ref 0.0–0.1)
BASOS ABS: 0.1 10*3/uL (ref 0.0–0.1)
BASOS PCT: 1 %
EOS ABS: 0.2 10*3/uL (ref 0.0–0.7)
EOS PCT: 3 %
HEMATOCRIT: 44.7 % (ref 39.0–52.0)
Hemoglobin: 14.9 g/dL (ref 13.0–17.0)
Immature Granulocytes: 1 %
Lymphocytes Relative: 48 %
Lymphs Abs: 3 10*3/uL (ref 0.7–4.0)
MCH: 30.7 pg (ref 26.0–34.0)
MCHC: 33.3 g/dL (ref 30.0–36.0)
MCV: 92.2 fL (ref 78.0–100.0)
Monocytes Absolute: 0.5 10*3/uL (ref 0.1–1.0)
Monocytes Relative: 9 %
Neutro Abs: 2.3 10*3/uL (ref 1.7–7.7)
Neutrophils Relative %: 38 %
PLATELETS: 229 10*3/uL (ref 150–400)
RBC: 4.85 MIL/uL (ref 4.22–5.81)
RDW: 12.5 % (ref 11.5–15.5)
WBC: 6.1 10*3/uL (ref 4.0–10.5)

## 2017-10-21 LAB — COMPREHENSIVE METABOLIC PANEL
ALBUMIN: 4.2 g/dL (ref 3.5–5.0)
ALT: 42 U/L (ref 0–44)
ANION GAP: 9 (ref 5–15)
AST: 35 U/L (ref 15–41)
Alkaline Phosphatase: 52 U/L (ref 38–126)
BILIRUBIN TOTAL: 0.6 mg/dL (ref 0.3–1.2)
BUN: 13 mg/dL (ref 6–20)
CO2: 23 mmol/L (ref 22–32)
Calcium: 9.3 mg/dL (ref 8.9–10.3)
Chloride: 109 mmol/L (ref 98–111)
Creatinine, Ser: 0.97 mg/dL (ref 0.61–1.24)
GFR calc Af Amer: >60 mL/min (ref >60)
GFR calc non Af Amer: >60 mL/min (ref >60)
GLUCOSE: 103 mg/dL — AB (ref 70–99)
POTASSIUM: 4.1 mmol/L (ref 3.5–5.1)
SODIUM: 141 mmol/L (ref 135–145)
Total Protein: 6.9 g/dL (ref 6.5–8.1)

## 2017-10-21 MED ORDER — OXYCODONE-ACETAMINOPHEN 5-325 MG PO TABS
1.0000 | ORAL_TABLET | Freq: Once | ORAL | Status: AC
  Administered 2017-10-21: 1 via ORAL
  Filled 2017-10-21: qty 1

## 2017-10-21 MED ORDER — IOHEXOL 300 MG/ML  SOLN
100.0000 mL | Freq: Once | INTRAMUSCULAR | Status: AC | PRN
  Administered 2017-10-21: 100 mL via INTRAVENOUS
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
## 2017-10-21 NOTE — ED Notes (Signed)
Pt continually removing C-collar despite education about necessity and reason for use. Pt has self removed c-collar and is spooning on side w/ girlfriend in bed.
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
## 2017-10-21 NOTE — ED Notes (Signed)
Pt asked tech for water, tech advised she would need to check w/ RN. Pt became belligerent and agitated stating "Fuck this, Fuck y'all. Y'all some lazy ass motherfuckers who ain't do shit." Pt asked to stop swearing. Pt stated "I'm a grown ass man y'all don't know who you're fucking with" Pt escorted out of ED while continuing to scream.
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
## 2018-07-19 ENCOUNTER — Ambulatory Visit (HOSPITAL_COMMUNITY)
Admission: EM | Admit: 2018-07-19 | Discharge: 2018-07-19 | Disposition: A | Discharge status: Home / Self Care | Payer: BLUE CROSS/BLUE SHIELD | Attending: Family Medicine | Admitting: Family Medicine

## 2018-07-19 ENCOUNTER — Other Ambulatory Visit: Payer: Self-pay

## 2018-07-19 ENCOUNTER — Encounter (HOSPITAL_COMMUNITY): Payer: Self-pay

## 2018-07-19 DIAGNOSIS — J039 Acute tonsillitis, unspecified: Secondary | ICD-10-CM | POA: Diagnosis not present

## 2018-07-19 DIAGNOSIS — M542 Cervicalgia: Secondary | ICD-10-CM | POA: Diagnosis not present

## 2018-07-19 DIAGNOSIS — R221 Localized swelling, mass and lump, neck: Secondary | ICD-10-CM | POA: Diagnosis not present

## 2018-07-19 MED ORDER — CEFTRIAXONE SODIUM 1 G IJ SOLR
1.0000 g | Freq: Once | INTRAMUSCULAR | Status: AC
  Administered 2018-07-19: 1 g via INTRAMUSCULAR

## 2018-07-19 MED ORDER — AMOXICILLIN-POT CLAVULANATE 875-125 MG PO TABS: 1.0000 | ORAL_TABLET | Freq: Two times a day (BID) | ORAL | 0 refills | Status: AC

## 2018-07-19 MED ORDER — KETOROLAC TROMETHAMINE 60 MG/2ML IM SOLN
60.0000 mg | Freq: Once | INTRAMUSCULAR | Status: AC
  Administered 2018-07-19: 60 mg via INTRAMUSCULAR

## 2018-07-19 MED ORDER — KETOROLAC TROMETHAMINE 60 MG/2ML IM SOLN
INTRAMUSCULAR | Status: AC
  Filled 2018-07-19: qty 2

## 2018-07-19 MED ORDER — MELOXICAM 7.5 MG PO TABS: 7.5000 mg | ORAL_TABLET | Freq: Every day | ORAL | 0 refills | Status: DC

## 2018-07-19 MED ORDER — LIDOCAINE HCL (PF) 1 % IJ SOLN
INTRAMUSCULAR | Status: AC
  Filled 2018-07-19: qty 2

## 2018-07-19 MED ORDER — CEFTRIAXONE SODIUM 1 G IJ SOLR
INTRAMUSCULAR | Status: AC
  Filled 2018-07-19: qty 10
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
## 2018-07-19 NOTE — ED Triage Notes (Signed)
Pt presents with facial/jaw pain & swelling from unknown source X 3 days.

## 2018-07-19 NOTE — Discharge Instructions (Signed)
Recommending further evaluation and management in the ED for potential neck, or retropharyngeal abscess.  Patient declines at this time.  Patient aware of the risk associated with this decision, including death.  Ceftriaxone shot given in office.  Augmentin prescribed take as directed and to completion.  Mobic for pain.  Go to the ED if you have difficulty breathing, trouble swallowing, fever, nausea, vomiting, worsening symptoms despite treatment.

## 2019-09-04 ENCOUNTER — Ambulatory Visit (HOSPITAL_COMMUNITY)
Admission: EM | Admit: 2019-09-04 | Discharge: 2019-09-04 | Disposition: A | Discharge status: Home / Self Care | Payer: BLUE CROSS/BLUE SHIELD | Attending: Family Medicine | Admitting: Family Medicine

## 2019-09-04 ENCOUNTER — Encounter (HOSPITAL_COMMUNITY): Payer: Self-pay

## 2019-09-04 DIAGNOSIS — R599 Enlarged lymph nodes, unspecified: Secondary | ICD-10-CM

## 2019-09-04 MED ORDER — DOXYCYCLINE HYCLATE 100 MG PO CAPS: 100.0000 mg | ORAL_CAPSULE | Freq: Two times a day (BID) | ORAL | 0 refills | Status: DC

## 2019-09-04 NOTE — ED Triage Notes (Signed)
Patient reports lump under the left ear. Reports pain shoots and radiates down his back.
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
## 2020-04-09 ENCOUNTER — Other Ambulatory Visit: Payer: Self-pay

## 2020-04-11 ENCOUNTER — Other Ambulatory Visit: Payer: Self-pay

## 2020-04-11 ENCOUNTER — Ambulatory Visit (HOSPITAL_COMMUNITY): Admission: EM | Admit: 2020-04-11 | Discharge: 2020-04-11 | Discharge status: Against Medical Advice | Payer: 59

## 2020-04-11 NOTE — ED Notes (Signed)
Pt called in lobby, no response 

## 2020-07-04 ENCOUNTER — Emergency Department (HOSPITAL_COMMUNITY)
Admission: EM | Admit: 2020-07-04 | Discharge: 2020-07-04 | Disposition: A | Discharge status: Home / Self Care | Payer: 59 | Attending: Emergency Medicine | Admitting: Emergency Medicine

## 2020-07-04 ENCOUNTER — Other Ambulatory Visit: Payer: Self-pay

## 2020-07-04 ENCOUNTER — Encounter (HOSPITAL_COMMUNITY): Payer: Self-pay | Admitting: *Deleted

## 2020-07-04 ENCOUNTER — Emergency Department (HOSPITAL_COMMUNITY): Payer: 59

## 2020-07-04 DIAGNOSIS — R042 Hemoptysis: Secondary | ICD-10-CM

## 2020-07-04 DIAGNOSIS — R072 Precordial pain: Secondary | ICD-10-CM | POA: Diagnosis not present

## 2020-07-04 LAB — COMPREHENSIVE METABOLIC PANEL
ALT: 43 U/L (ref 0–44)
AST: 23 U/L (ref 15–41)
Albumin: 4.3 g/dL (ref 3.5–5.0)
Alkaline Phosphatase: 56 U/L (ref 38–126)
Anion gap: 6 (ref 5–15)
BUN: 13 mg/dL (ref 6–20)
CO2: 27 mmol/L (ref 22–32)
Calcium: 9.4 mg/dL (ref 8.9–10.3)
Chloride: 102 mmol/L (ref 98–111)
Creatinine, Ser: 1.06 mg/dL (ref 0.61–1.24)
GFR, Estimated: >60 mL/min (ref >60)
Glucose, Bld: 101 mg/dL — ABNORMAL HIGH (ref 70–99)
Potassium: 4.1 mmol/L (ref 3.5–5.1)
Sodium: 135 mmol/L (ref 135–145)
Total Bilirubin: 0.8 mg/dL (ref 0.3–1.2)
Total Protein: 7.2 g/dL (ref 6.5–8.1)

## 2020-07-04 LAB — CBC
HCT: 45.9 % (ref 39.0–52.0)
Hemoglobin: 15.3 g/dL (ref 13.0–17.0)
MCH: 30.9 pg (ref 26.0–34.0)
MCHC: 33.3 g/dL (ref 30.0–36.0)
MCV: 92.7 fL (ref 80.0–100.0)
Platelets: 246 10*3/uL (ref 150–400)
RBC: 4.95 MIL/uL (ref 4.22–5.81)
RDW: 12.8 % (ref 11.5–15.5)
WBC: 8.2 10*3/uL (ref 4.0–10.5)
nRBC: 0 % (ref 0.0–0.2)

## 2020-07-04 LAB — TYPE AND SCREEN
ABO/RH(D): A POS
Antibody Screen: NEGATIVE

## 2020-07-04 LAB — TROPONIN I (HIGH SENSITIVITY): Troponin I (High Sensitivity): 5 ng/L (ref <18)

## 2020-07-04 LAB — D-DIMER, QUANTITATIVE: D-Dimer, Quant: <0.27 ug/mL-FEU (ref 0.00–0.50)

## 2020-07-04 LAB — LIPASE, BLOOD: Lipase: 25 U/L (ref 11–51)

## 2020-07-04 NOTE — ED Provider Notes (Signed)
[facility] EMERGENCY DEPARTMENT Provider Note   CSN: [id number] Arrival date & time: 07/04/20  1008     History Chief Complaint  Patient presents with  . Hemoptysis    Kurt Thornton is a 25 y.o. male who presents to the ED today with complaint of hemoptysis that began earlier this morning.  Patient reports he woke up this morning feeling fine.  Went to [employer] when he began having some substernal chest pain.  He states he felt something in the back of his throat and tried to expectorate it back up when he noticed a large dark brown clot.  He states that since then he has been spitting up blood.  He does report he was drinking last night and has a history of heavy alcohol use.  He states that he has intermittent abdominal pain that has been present for approximately 6 months now.  He has not been evaluated for same.  He denies any shortness of breath.  He has no history of DVT/PE. He does travel quite frequently between [facility], [place], and [place]. No exogenous hormone use.  No active malignancy. Denies nausea or vomiting. Pt is a never smoker.   The history is provided by the patient and medical records.       History reviewed. No pertinent past medical history.  There are no problems to display for this patient.   History reviewed. No pertinent surgical history.     No family history on file.  Social History   Tobacco Use  . Smoking status: Never Smoker  . Smokeless tobacco: Never Used  Substance Use Topics  . Alcohol use: Yes  . Drug use: Yes    Types: Marijuana    Home Medications Prior to Admission medications   Not on File    Allergies    Patient has no allergy information on record.  Review of Systems   Review of Systems  Constitutional: Negative for chills and fever.  Respiratory: Positive for cough. Negative for shortness of breath.   Cardiovascular: Positive for chest pain. Negative for palpitations and leg swelling.  Gastrointestinal:  Positive for abdominal pain. Negative for nausea and vomiting.  All other systems reviewed and are negative.   Physical Exam Updated Vital Signs BP (!) 143/93 (BP Location: Left Arm)   Pulse 66   Temp 98.2 F (36.8 C) (Oral)   Resp 18   Ht 5\' 9"  (1.753 m)   Wt 86.2 kg   SpO2 100%   BMI 28.06 kg/m   Physical Exam Vitals and nursing note reviewed.  Constitutional:      Appearance: He is not ill-appearing or diaphoretic.  HENT:     Head: Normocephalic and atraumatic.     Nose: Nose normal.     Mouth/Throat:     Mouth: Mucous membranes are moist.     Comments: Large blood clot appreciated to right posterior oropharynx overlying right tonsil  Eyes:     Conjunctiva/sclera: Conjunctivae normal.  Cardiovascular:     Rate and Rhythm: Normal rate and regular rhythm.     Pulses: Normal pulses.  Pulmonary:     Effort: Pulmonary effort is normal.     Breath sounds: Normal breath sounds. No wheezing, rhonchi or rales.  Chest:     Chest Heard: No tenderness.  Abdominal:     Palpations: Abdomen is soft.     Tenderness: There is no abdominal tenderness. There is no guarding or rebound.  Musculoskeletal:  General: No tenderness.     Cervical back: Neck supple.     Right lower leg: No edema.     Left lower leg: No edema.  Skin:    General: Skin is warm and dry.  Neurological:     Mental Status: He is alert.     ED Results / Procedures / Treatments   Labs (all labs ordered are listed, but only abnormal results are displayed) Labs Reviewed  COMPREHENSIVE METABOLIC PANEL - Abnormal; Notable for the following components:      Result Value   Glucose, Bld 101 (*)    All other components within normal limits  CBC  D-DIMER, QUANTITATIVE  LIPASE, BLOOD  POC OCCULT BLOOD, ED  TYPE AND SCREEN  ABO/RH  TROPONIN I (HIGH SENSITIVITY)  TROPONIN I (HIGH SENSITIVITY)    EKG EKG Interpretation  Date/Time:  Friday July 04 2020 10:33:42 EDT Ventricular Rate:  62 PR  Interval:  158 QRS Duration: 82 QT Interval:  390 QTC Calculation: 396 R Axis:   94 Text Interpretation: Sinus arrhythmia Borderline right axis deviation no old tracing for comparison Confirmed by Alona Bene [phone]) on 07/04/2020 10:36:10 AM   Radiology DG Chest 2 View  Result Date: 07/04/2020 CLINICAL DATA:  Coughing up blood EXAM: CHEST - 2 VIEW COMPARISON:  2018 FINDINGS: The heart size and mediastinal contours are within normal limits. Both lungs are clear. No pleural effusion. The visualized skeletal structures are unremarkable. IMPRESSION: No acute process in the chest. Electronically Signed   By: Guadlupe Spanish M.D.   On: 07/04/2020 11:47    Procedures Procedures   Medications Ordered in ED Medications - No data to display  ED Course  I have reviewed the triage vital signs and the nursing notes.  Pertinent labs & imaging results that were available during my care of the patient were reviewed by me and considered in my medical decision making (see chart for details).  Clinical Course as of 07/04/20 1340  Fri Jul 04, 2020  1318 D-Dimer, Quant: <0.27 [MV]    Clinical Course User Index [MV] Tanda Rockers, PA-C   MDM Rules/Calculators/A&P                          25 year old male presenting to the ED today with complaint of coughing/spitting up blood that began today with associated substernal chest pain. Does travel frequently from [place], [place], and [place]. No other risk factors for PE/DVT. On arrival to the ED VSS. Pt is nontachycardic and nonhypoxic with O2 sats at 100% on RA. He appears to be in NAD. He has a [employer] cup with him with a small amount of blood sputum in it. On exam he does have a large blood clot to the right posterior oropharynx covering the right tonsil. No sore throat complaint. Phonating normally. He denies any recent oral/ENT surgery to suggest this as the cause of blood. It does sound more like he is spitting up blood vs coughing up blood however will  need to work up for PE at this time. Will plan for EKG, CXR, troponin and D dimer. He does mention intermittent abdominal pain with hx of heavy drinking - he denies emesis/hematemesis as the cause of bleeding however will obtain abdominal screening labs as well. His abdomen is nontender at this time. Pt otherwise hemodynamically stable at this time.   EKG without acute ischemic changes CXR clear CBC without leukocytosis. Hgb stable at 15.3 CMP without  electrolyte abnormalities D dimer has returned < 0.27. I have very low suspicion for PE at this time given pt is stating he is spitting up blood and has an active area of bleeding/possible source to posterior oropharynx on right side. Will plan to ambulate with pulse ox and if pt does not desat do not feel he requires additional work up. Troponin and lipase still pending.   1:39 PM Nursing staff informed me that pt requested to have his IV taken out and that he wanted to leave as he was hungry. I was unable to speak with him regarding the risks of leaving prior to work up being complete. Pt was not ambulated with pulse ox prior to leaving.   This note was prepared using Dragon voice recognition software and may include unintentional dictation errors due to the inherent limitations of voice recognition software.  Final Clinical Impression(s) / ED Diagnoses Final diagnoses:  Spitting up blood    Rx / DC Orders ED Discharge Orders    None       Tanda Rockers, PA-C 07/04/20 1340    Long, Arlyss Repress, MD 07/08/20 1012

## 2020-07-04 NOTE — ED Triage Notes (Signed)
Co abd. Pain for several days states he was walking out of [employer] this am and cough and when he did it was bright blood. Denies pain at present

## 2020-07-04 NOTE — ED Notes (Signed)
Patient left before EDP could dispo. States he was hungry and he was leaving.

## 2020-07-04 NOTE — ED Notes (Signed)
Pt seen punching pillows and angry that he was hungry and we couldn't give him food just yet. Pt dressed himself, seen punching the doors to the lobby, and leaving.

## 2020-07-07 ENCOUNTER — Encounter (HOSPITAL_COMMUNITY): Payer: Self-pay

## 2021-04-01 ENCOUNTER — Emergency Department (HOSPITAL_COMMUNITY): Payer: 59

## 2021-04-01 ENCOUNTER — Encounter (HOSPITAL_COMMUNITY): Payer: Self-pay

## 2021-04-01 ENCOUNTER — Other Ambulatory Visit: Payer: Self-pay

## 2021-04-01 ENCOUNTER — Emergency Department (HOSPITAL_COMMUNITY)
Admission: EM | Admit: 2021-04-01 | Discharge: 2021-04-01 | Disposition: A | Discharge status: Home / Self Care | Payer: 59 | Attending: Emergency Medicine | Admitting: Emergency Medicine

## 2021-04-01 DIAGNOSIS — R079 Chest pain, unspecified: Secondary | ICD-10-CM | POA: Insufficient documentation

## 2021-04-01 LAB — CBC WITH DIFFERENTIAL/PLATELET
Abs Immature Granulocytes: 0.02 10*3/uL (ref 0.00–0.07)
Basophils Absolute: 0.1 10*3/uL (ref 0.0–0.1)
Basophils Relative: 1 %
Eosinophils Absolute: 0.2 10*3/uL (ref 0.0–0.5)
Eosinophils Relative: 3 %
HCT: 43.4 % (ref 39.0–52.0)
Hemoglobin: 15.1 g/dL (ref 13.0–17.0)
Immature Granulocytes: 0 %
Lymphocytes Relative: 53 %
Lymphs Abs: 4.2 10*3/uL — ABNORMAL HIGH (ref 0.7–4.0)
MCH: 32.1 pg (ref 26.0–34.0)
MCHC: 34.8 g/dL (ref 30.0–36.0)
MCV: 92.3 fL (ref 80.0–100.0)
Monocytes Absolute: 0.7 10*3/uL (ref 0.1–1.0)
Monocytes Relative: 9 %
Neutro Abs: 2.6 10*3/uL (ref 1.7–7.7)
Neutrophils Relative %: 34 %
Platelets: 240 10*3/uL (ref 150–400)
RBC: 4.7 MIL/uL (ref 4.22–5.81)
RDW: 12.8 % (ref 11.5–15.5)
WBC: 7.8 10*3/uL (ref 4.0–10.5)
nRBC: 0 % (ref 0.0–0.2)

## 2021-04-01 LAB — COMPREHENSIVE METABOLIC PANEL
ALT: 36 U/L (ref 0–44)
AST: 33 U/L (ref 15–41)
Albumin: 4 g/dL (ref 3.5–5.0)
Alkaline Phosphatase: 52 U/L (ref 38–126)
Anion gap: 5 (ref 5–15)
BUN: 18 mg/dL (ref 6–20)
CO2: 22 mmol/L (ref 22–32)
Calcium: 8.9 mg/dL (ref 8.9–10.3)
Chloride: 108 mmol/L (ref 98–111)
Creatinine, Ser: 1.06 mg/dL (ref 0.61–1.24)
GFR, Estimated: >60 mL/min (ref >60)
Glucose, Bld: 96 mg/dL (ref 70–99)
Potassium: 4.5 mmol/L (ref 3.5–5.1)
Sodium: 135 mmol/L (ref 135–145)
Total Bilirubin: 0.9 mg/dL (ref 0.3–1.2)
Total Protein: 6.8 g/dL (ref 6.5–8.1)

## 2021-04-01 LAB — TROPONIN I (HIGH SENSITIVITY): Troponin I (High Sensitivity): 3 ng/L (ref <18)

## 2021-04-01 MED ORDER — ACETAMINOPHEN 500 MG PO TABS
1000.0000 mg | ORAL_TABLET | Freq: Once | ORAL | Status: AC
  Administered 2021-04-01: 06:00:00 1000 mg via ORAL
  Filled 2021-04-01: qty 2

## 2021-04-01 MED ORDER — METHOCARBAMOL 500 MG PO TABS
1000.0000 mg | ORAL_TABLET | Freq: Once | ORAL | Status: AC
  Administered 2021-04-01: 06:00:00 1000 mg via ORAL
  Filled 2021-04-01: qty 2

## 2021-04-01 NOTE — ED Triage Notes (Signed)
Pt reports with left sided chest pain after doing push ups yesterday. Pt states that he feels sore.
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
## 2021-04-01 NOTE — Discharge Instructions (Addendum)
You were evaluated in the Emergency Department and after careful evaluation, we did not find any emergent condition requiring admission or further testing in the hospital.  Your exam/testing today was overall reassuring.  X-ray was normal, labs were normal.  Suspect strain or sprain of a muscle of the chest [place].  Recommend Tylenol or Motrin for pain.  Please return to the Emergency Department if you experience any worsening of your condition.  Thank you for allowing [place] to be a part of your care.

## 2021-12-13 IMAGING — DX DG CHEST 2V
2 series · 2 of 2 positions shown · non-contrast
Comparison: 0973

CLINICAL DATA: Coughing up blood

EXAM:
CHEST - 2 VIEW

[w chest pa]
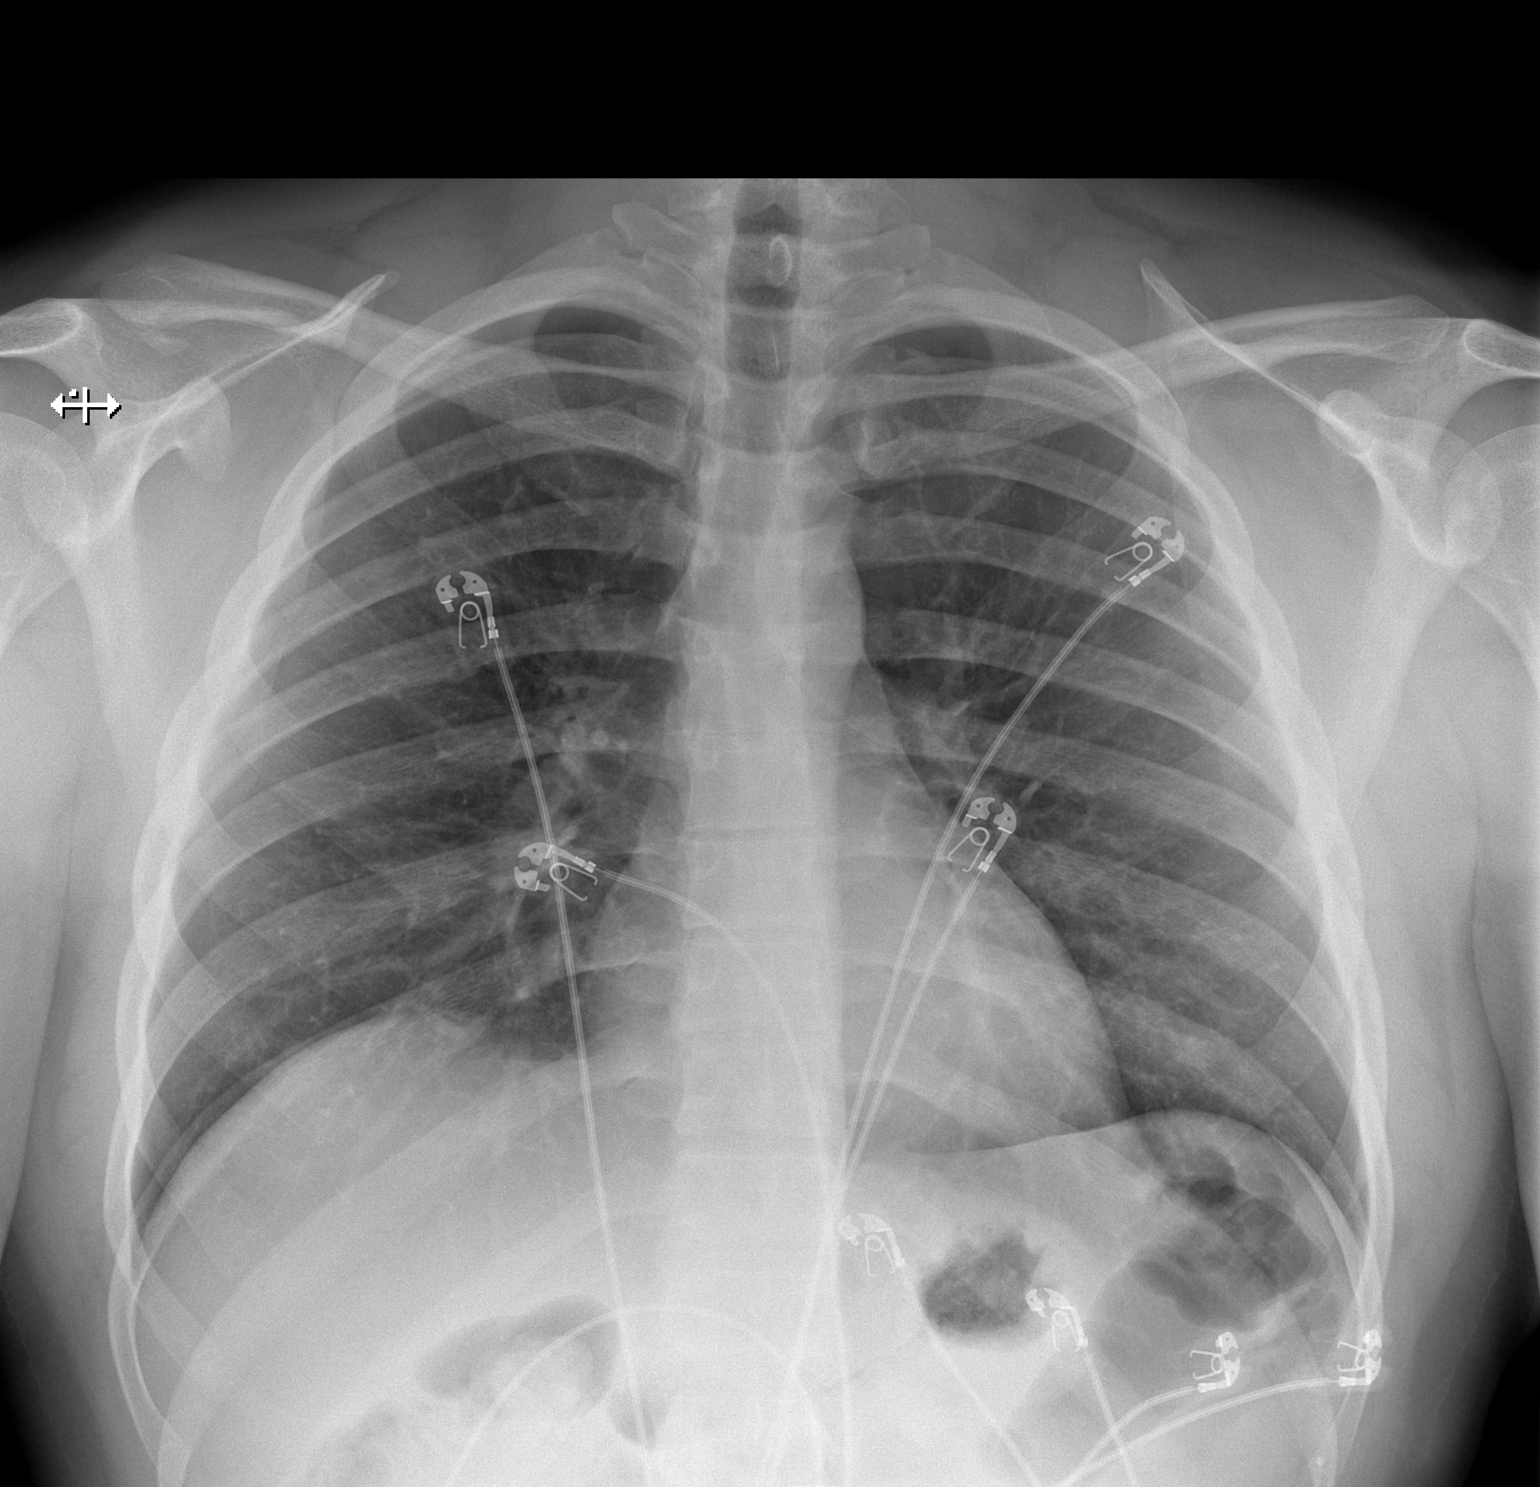

[w chest lat]
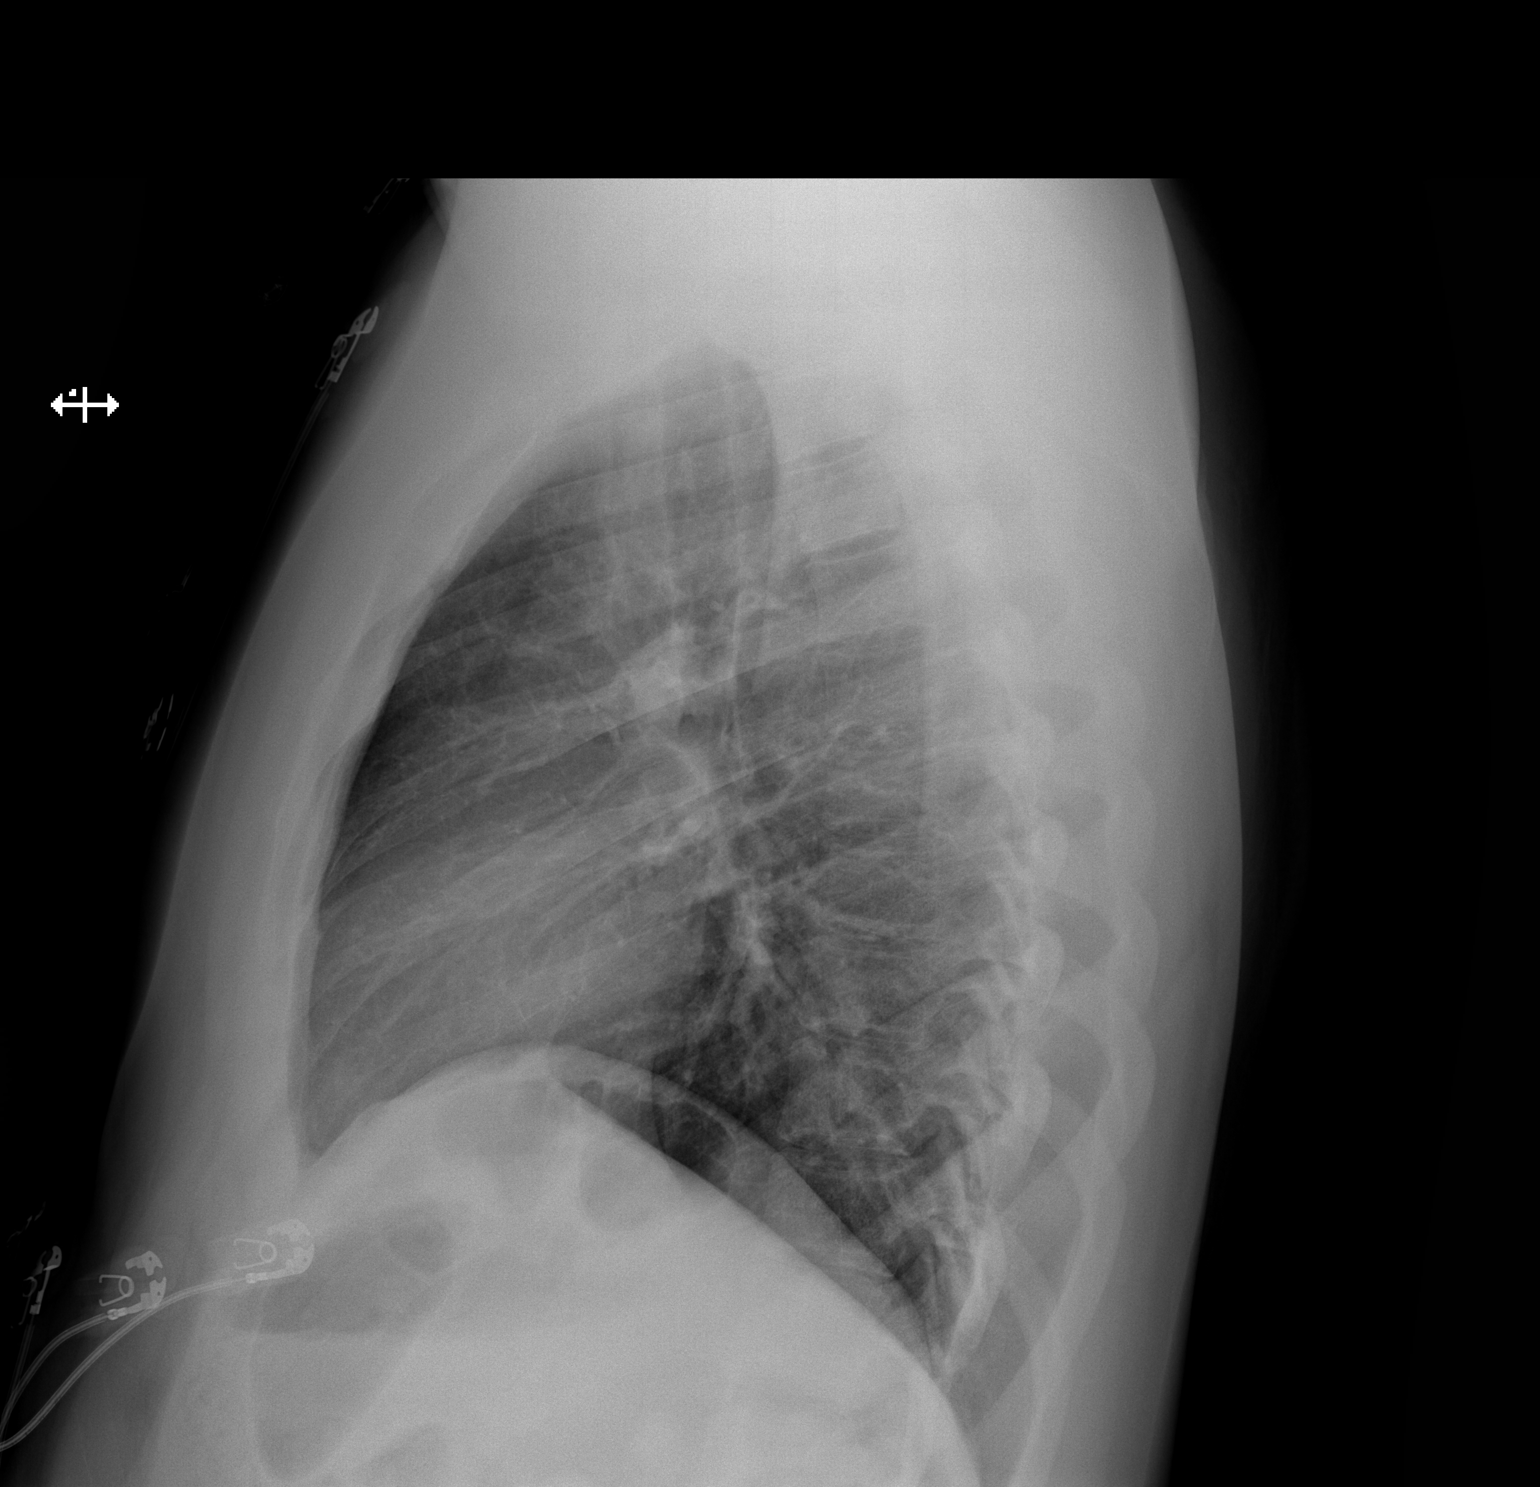

[2 of 2 positions shown; findings below may reference images not displayed]

FINDINGS: The heart size and mediastinal contours are within normal limits.
Both lungs are clear. No pleural effusion. The visualized skeletal
structures are unremarkable.
IMPRESSION: No acute process in the chest.

## 2022-04-16 ENCOUNTER — Ambulatory Visit (HOSPITAL_COMMUNITY)
Admission: EM | Admit: 2022-04-16 | Discharge: 2022-04-16 | Disposition: A | Discharge status: Home / Self Care | Payer: Commercial Managed Care - HMO | Attending: Internal Medicine | Admitting: Internal Medicine

## 2022-04-16 ENCOUNTER — Encounter (HOSPITAL_COMMUNITY): Payer: Self-pay

## 2022-04-16 DIAGNOSIS — R509 Fever, unspecified: Secondary | ICD-10-CM | POA: Diagnosis present

## 2022-04-16 DIAGNOSIS — F17210 Nicotine dependence, cigarettes, uncomplicated: Secondary | ICD-10-CM | POA: Insufficient documentation

## 2022-04-16 DIAGNOSIS — Z1152 Encounter for screening for COVID-19: Secondary | ICD-10-CM | POA: Insufficient documentation

## 2022-04-16 DIAGNOSIS — R059 Cough, unspecified: Secondary | ICD-10-CM | POA: Diagnosis present

## 2022-04-16 DIAGNOSIS — J069 Acute upper respiratory infection, unspecified: Secondary | ICD-10-CM | POA: Insufficient documentation

## 2022-04-16 LAB — SARS CORONAVIRUS 2 (TAT 6-24 HRS): SARS Coronavirus 2: NEGATIVE

## 2022-04-16 MED ORDER — GUAIFENESIN 200 MG PO TABS: 200.0000 mg | ORAL_TABLET | ORAL | 0 refills | Status: DC | PRN

## 2022-04-16 MED ORDER — FLUTICASONE PROPIONATE 50 MCG/ACT NA SUSP: 1.0000 | Freq: Every day | NASAL | 0 refills | Status: DC

## 2022-04-16 NOTE — Discharge Instructions (Signed)
You have a viral upper respiratory infection which should run its course and self resolve with symptomatic treatment as we discussed.  I have prescribed you 2 medications to help alleviate symptoms.  COVID test is pending.  We will call if it is positive.  Please follow-up if any symptoms persist or worsen.

## 2022-04-16 NOTE — ED Triage Notes (Signed)
Pt c/o cough, fever, chills, and body aches since Tuesday. Taking tylenol with no relief.
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
## 2022-08-22 IMAGING — CR DG CHEST 2V
2 series · 2 of 2 positions shown · non-contrast
Comparison: No priors.

CLINICAL DATA: 25-year-old male with history of left-sided chest
pain since yesterday.

EXAM:
CHEST - 2 VIEW

[w chest pa]
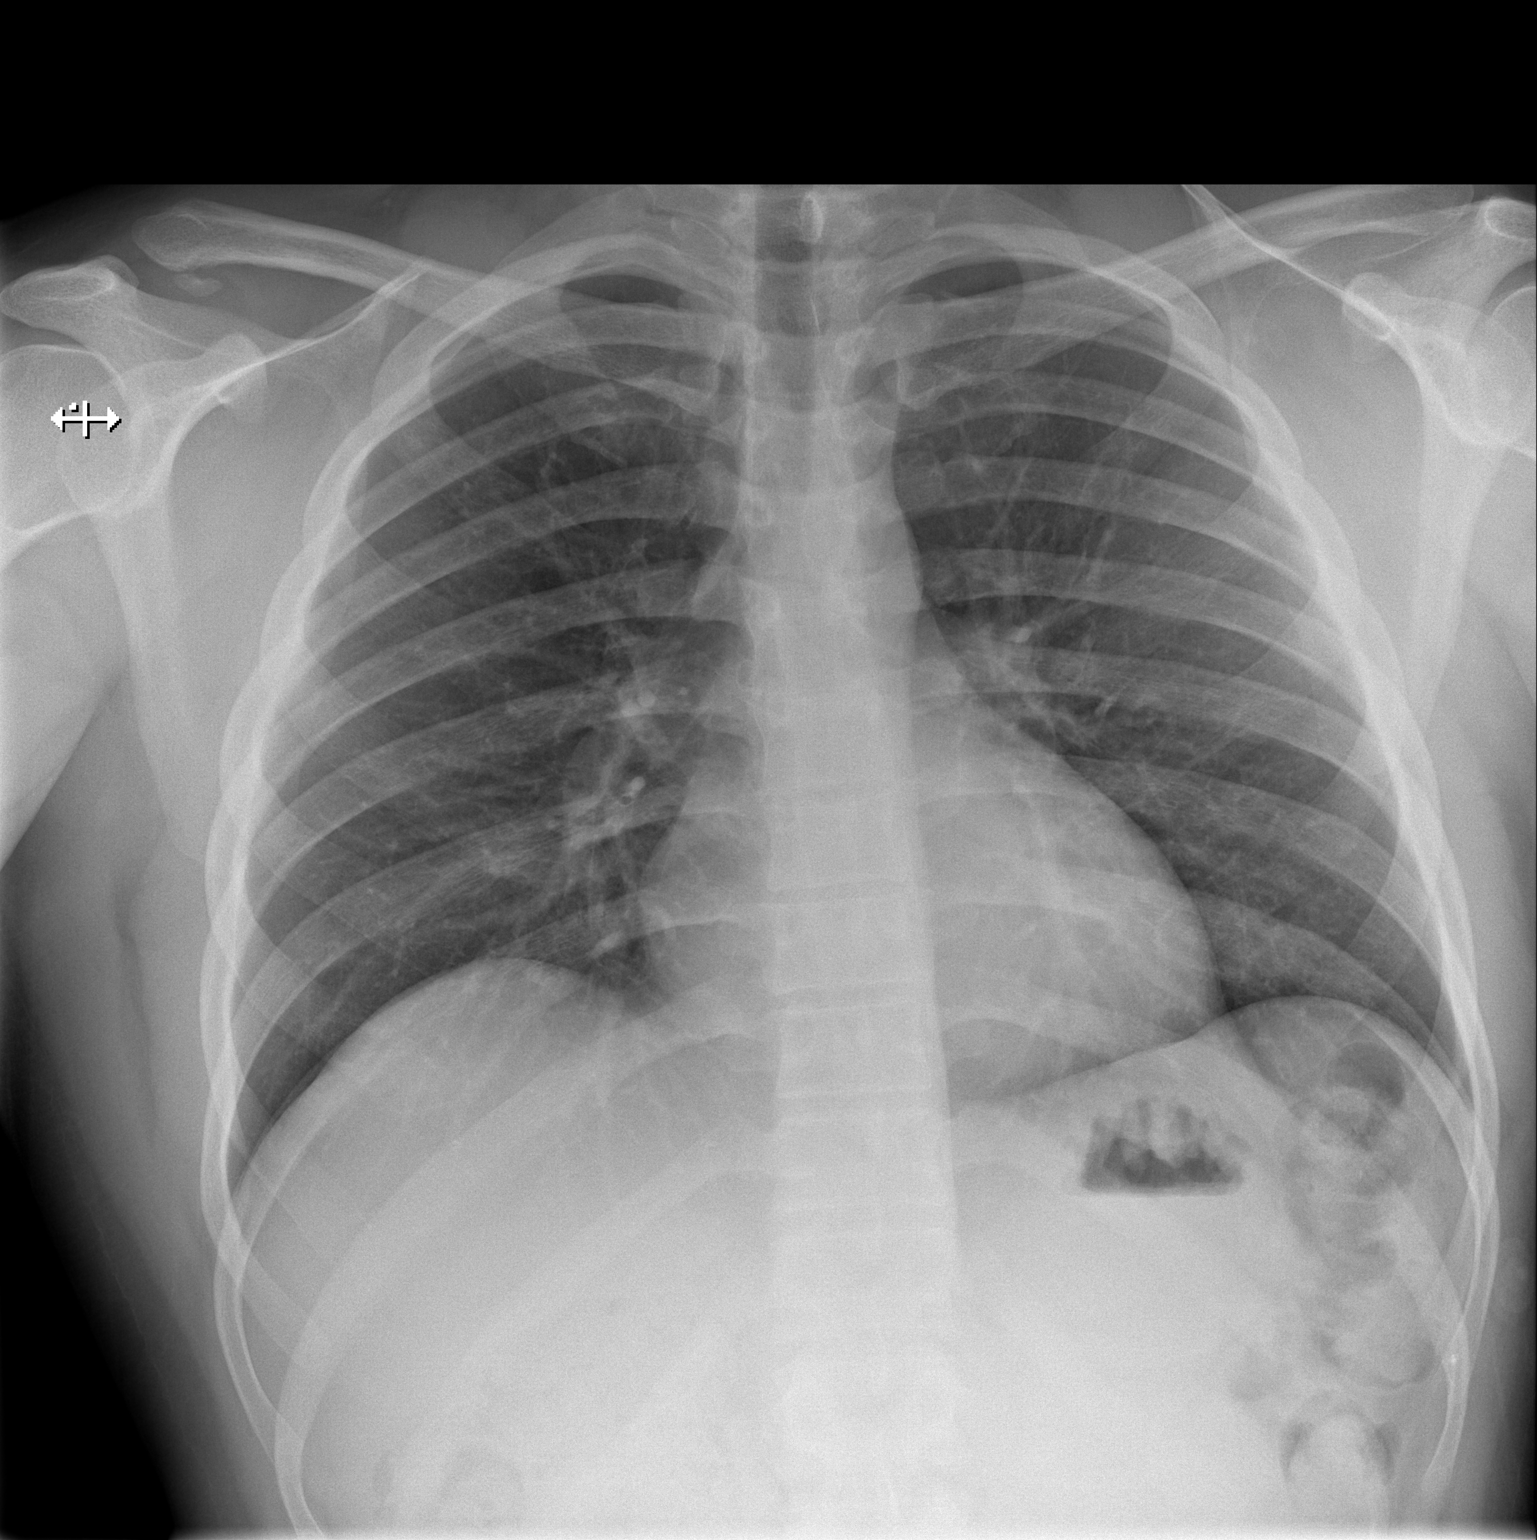

[w chest lat]
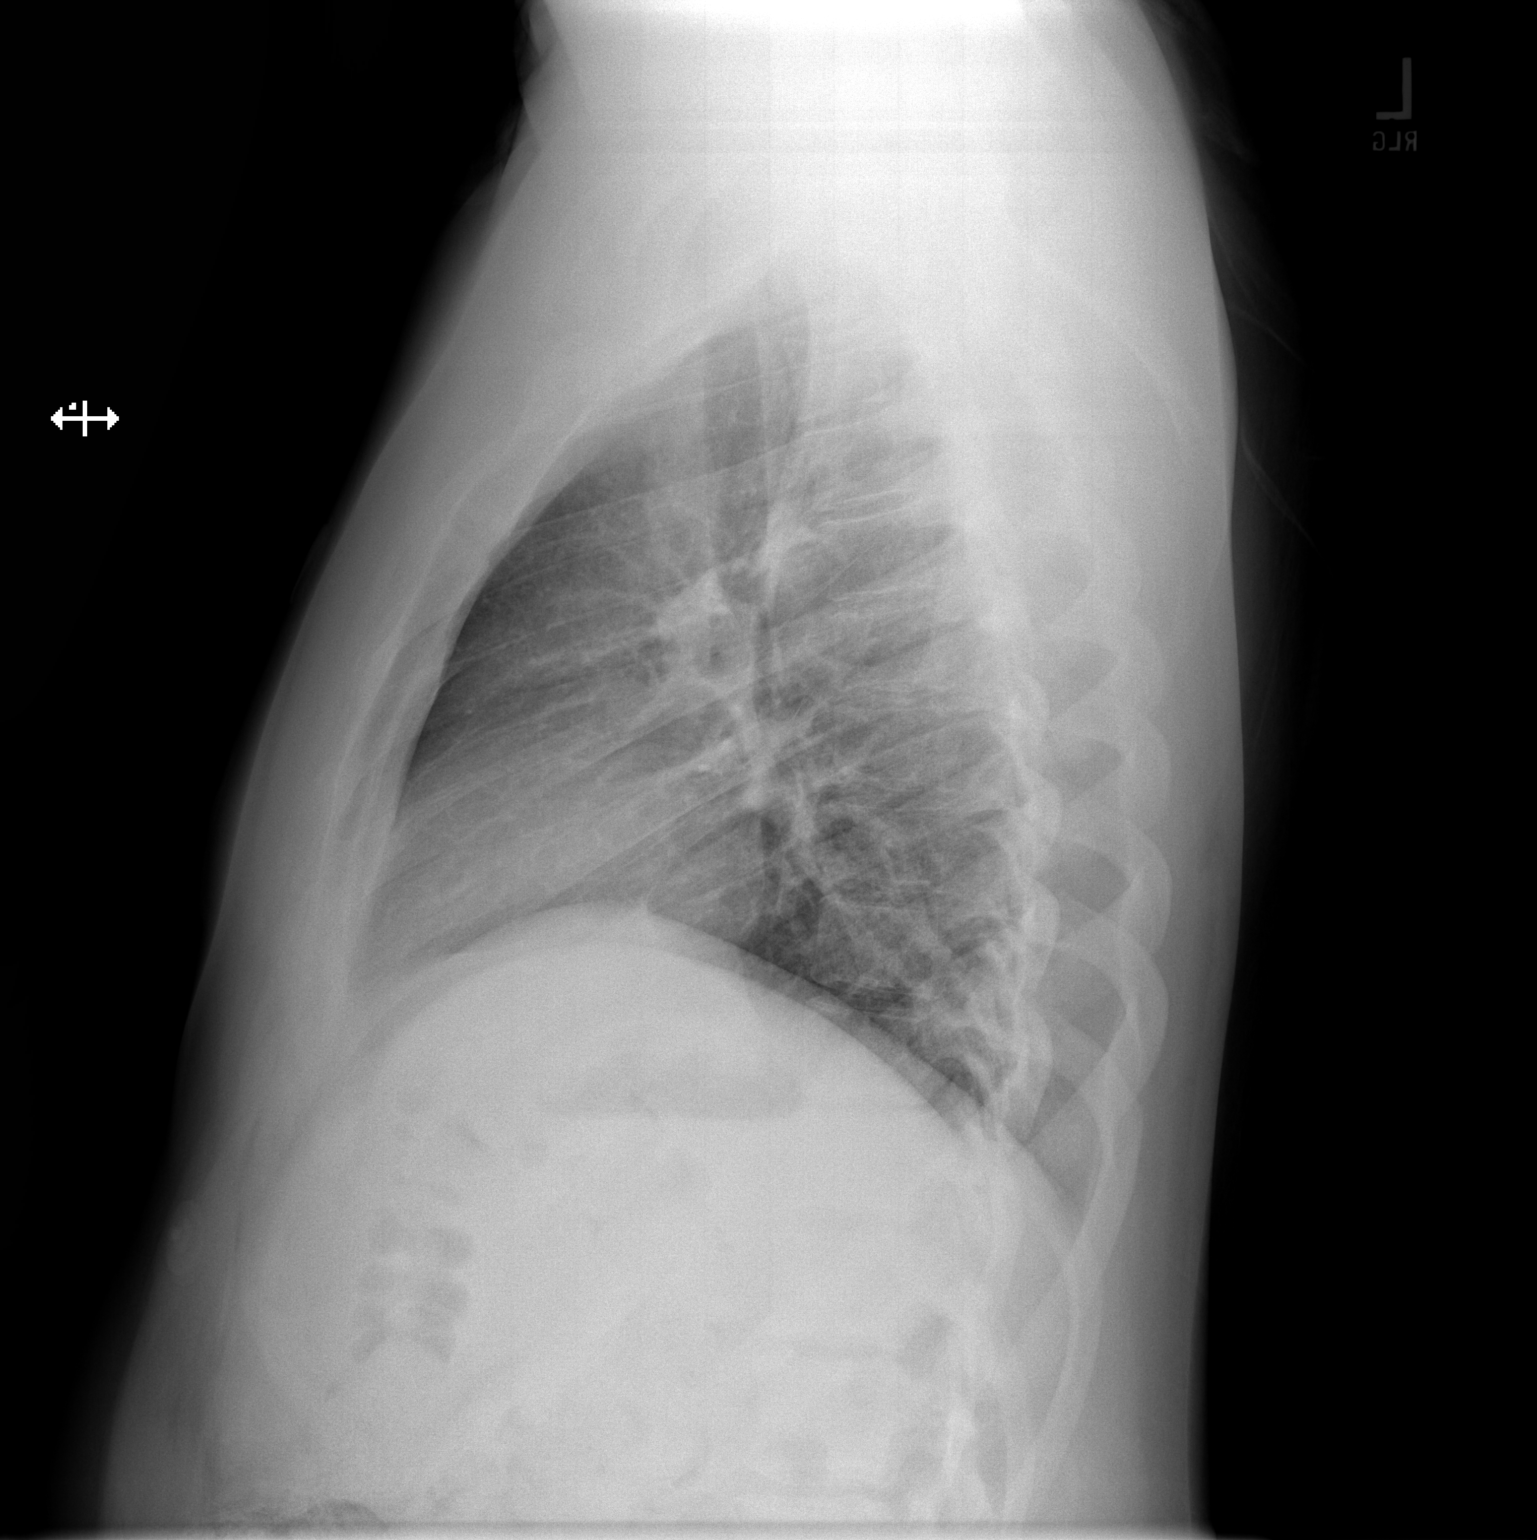

[2 of 2 positions shown; findings below may reference images not displayed]

FINDINGS: Lung volumes are normal. No consolidative airspace disease. No
pleural effusions. No pneumothorax. No pulmonary nodule or mass
noted. Pulmonary vasculature and the cardiomediastinal silhouette
are within normal limits.
IMPRESSION: No radiographic evidence of acute cardiopulmonary disease.

## 2022-10-08 ENCOUNTER — Ambulatory Visit (HOSPITAL_COMMUNITY)
Admission: EM | Admit: 2022-10-08 | Discharge: 2022-10-08 | Disposition: A | Discharge status: Home / Self Care | Payer: Managed Care, Other (non HMO) | Attending: Family Medicine | Admitting: Family Medicine

## 2022-10-08 ENCOUNTER — Encounter (HOSPITAL_COMMUNITY): Payer: Self-pay | Admitting: *Deleted

## 2022-10-08 DIAGNOSIS — J069 Acute upper respiratory infection, unspecified: Secondary | ICD-10-CM

## 2022-10-08 DIAGNOSIS — U071 COVID-19: Secondary | ICD-10-CM | POA: Insufficient documentation

## 2022-10-08 LAB — SARS CORONAVIRUS 2 (TAT 6-24 HRS): SARS Coronavirus 2: POSITIVE — AB

## 2022-10-08 MED ORDER — PROMETHAZINE-DM 6.25-15 MG/5ML PO SYRP: 5.0000 mL | ORAL_SOLUTION | Freq: Four times a day (QID) | ORAL | 0 refills | Status: DC | PRN

## 2022-10-08 NOTE — ED Triage Notes (Signed)
Pt states he has had cough and body aches X 3 days. He has taken mucinex and percocet.

## 2022-10-08 NOTE — Discharge Instructions (Signed)
Take Phenergan with dextromethorphan syrup--5 mL or 1 teaspoon every 6 hours as needed for cough  You can take ibuprofen 200 mg--2 tablets every 6 hours as needed for aches or fever.  You can continue taking the Mucinex   You have been swabbed for COVID, and the test will result in the next 24 hours. Our staff will call you if positive. If the COVID test is positive, you should quarantine until you are fever free for 24 hours and you are starting to feel better, and then take added precautions for the next 5 days, such as physical distancing/wearing a mask and good hand hygiene/washing.
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
## 2022-10-11 ENCOUNTER — Ambulatory Visit (HOSPITAL_COMMUNITY)
Admission: RE | Admit: 2022-10-11 | Discharge: 2022-10-11 | Disposition: A | Discharge status: Home / Self Care | Payer: Managed Care, Other (non HMO) | Source: Ambulatory Visit | Attending: Family Medicine | Admitting: Family Medicine

## 2022-10-11 ENCOUNTER — Other Ambulatory Visit: Payer: Self-pay

## 2022-10-11 ENCOUNTER — Encounter (HOSPITAL_COMMUNITY): Payer: Self-pay

## 2022-10-11 VITALS — BP 125/86 | HR 80 | Temp 98.8°F | Resp 18

## 2022-10-11 DIAGNOSIS — U071 COVID-19: Secondary | ICD-10-CM | POA: Diagnosis present

## 2022-10-11 MED ORDER — PROMETHAZINE-DM 6.25-15 MG/5ML PO SYRP: 5.0000 mL | ORAL_SOLUTION | Freq: Four times a day (QID) | ORAL | 0 refills | Status: AC | PRN
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
## 2022-10-11 NOTE — Discharge Instructions (Addendum)
Take Phenergan with dextromethorphan syrup--5 mL or 1 teaspoon every 6 hours as needed for cough  We have done a COVID swab again today for you.  It will result in the next 24 hours.  I think it is likely that your COVID swab will be positive still.  You are not considered as contagious after about 5 days of illness.  Continue washing her hands and wearing a mask when you are in close contact with others for another 5 days.

## 2022-10-11 NOTE — ED Triage Notes (Addendum)
Pt last seen on 10-08-22. Pt reports he did not get any medicine . Pt took a COVID test at [facility] which was Neg and did a at home test which was neg.  Pt wants meds for cough and a COVID test. Pt has a 73 week old baby at home and wants to make sure he is Neg for COVID

## 2022-10-12 LAB — SARS CORONAVIRUS 2 (TAT 6-24 HRS): SARS Coronavirus 2: POSITIVE — AB

## 2022-12-28 ENCOUNTER — Ambulatory Visit (HOSPITAL_COMMUNITY)
Admission: EM | Admit: 2022-12-28 | Discharge: 2022-12-28 | Disposition: A | Discharge status: Home / Self Care | Payer: Commercial Managed Care - HMO

## 2022-12-28 NOTE — ED Notes (Signed)
Called patient twice from the lobby. No answer

## 2022-12-28 NOTE — ED Notes (Signed)
No answer from lobby  

## 2024-02-27 ENCOUNTER — Emergency Department (HOSPITAL_COMMUNITY)

## 2024-02-27 ENCOUNTER — Encounter (HOSPITAL_COMMUNITY): Payer: Self-pay | Admitting: *Deleted

## 2024-02-27 ENCOUNTER — Emergency Department (HOSPITAL_COMMUNITY)
Admission: EM | Admit: 2024-02-27 | Discharge: 2024-02-27 | Disposition: A | Discharge status: Home / Self Care | Attending: Emergency Medicine | Admitting: Emergency Medicine

## 2024-02-27 ENCOUNTER — Other Ambulatory Visit: Payer: Self-pay

## 2024-02-27 DIAGNOSIS — M79632 Pain in left forearm: Secondary | ICD-10-CM | POA: Insufficient documentation

## 2024-02-27 DIAGNOSIS — M79602 Pain in left arm: Secondary | ICD-10-CM

## 2024-02-27 MED ORDER — KETOROLAC TROMETHAMINE 30 MG/ML IJ SOLN
30.0000 mg | Freq: Once | INTRAMUSCULAR | Status: AC
  Administered 2024-02-27: 30 mg via INTRAMUSCULAR
  Filled 2024-02-27: qty 1

## 2024-02-27 NOTE — Discharge Instructions (Signed)
 Please continue your home medications and follow-up with your orthopedist and pain management team.
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
## 2024-02-27 NOTE — ED Notes (Signed)
 Patient transported to X-ray
